# Patient Record
Sex: Male | Born: 1975 | Race: Black or African American | Hispanic: No | Marital: Single | Smoking: Never smoker
Health system: Southern US, Community
[De-identification: ages and names within clinical notes are randomized; demographics above are authoritative.]

---

## 2002-01-30 HISTORY — PX: CHOLECYSTECTOMY: SHX55

## 2008-05-27 ENCOUNTER — Emergency Department (HOSPITAL_COMMUNITY): Admission: EM | Admit: 2008-05-27 | Discharge: 2008-05-27 | Payer: Self-pay | Admitting: Emergency Medicine

## 2009-03-20 ENCOUNTER — Emergency Department: Payer: Self-pay | Admitting: Unknown Physician Specialty

## 2012-08-29 ENCOUNTER — Emergency Department: Payer: Self-pay | Admitting: Emergency Medicine

## 2012-09-04 ENCOUNTER — Ambulatory Visit: Payer: Self-pay | Admitting: Family Medicine

## 2013-01-26 ENCOUNTER — Emergency Department: Payer: Self-pay | Admitting: Emergency Medicine

## 2013-01-26 LAB — RAPID INFLUENZA A&B ANTIGENS

## 2015-02-26 ENCOUNTER — Ambulatory Visit: Payer: Self-pay | Admitting: Family Medicine

## 2015-03-09 ENCOUNTER — Ambulatory Visit (INDEPENDENT_AMBULATORY_CARE_PROVIDER_SITE_OTHER): Payer: Self-pay | Admitting: Family Medicine

## 2015-03-09 ENCOUNTER — Encounter: Payer: Self-pay | Admitting: Family Medicine

## 2015-03-09 VITALS — BP 120/80 | HR 84 | Temp 98.4°F | Resp 16 | Ht 67.0 in | Wt 195.4 lb

## 2015-03-09 DIAGNOSIS — M545 Low back pain, unspecified: Secondary | ICD-10-CM | POA: Insufficient documentation

## 2015-03-09 DIAGNOSIS — J069 Acute upper respiratory infection, unspecified: Secondary | ICD-10-CM

## 2015-03-09 DIAGNOSIS — R0789 Other chest pain: Secondary | ICD-10-CM

## 2015-03-09 DIAGNOSIS — G8929 Other chronic pain: Secondary | ICD-10-CM | POA: Insufficient documentation

## 2015-03-09 DIAGNOSIS — Z23 Encounter for immunization: Secondary | ICD-10-CM

## 2015-03-09 DIAGNOSIS — M940 Chondrocostal junction syndrome [Tietze]: Secondary | ICD-10-CM

## 2015-03-09 MED ORDER — IBUPROFEN 800 MG PO TABS: 800.0000 mg | ORAL_TABLET | Freq: Three times a day (TID) | ORAL | Status: DC

## 2015-03-09 MED ORDER — HYDROCOD POLST-CPM POLST ER 10-8 MG/5ML PO SUER: 5.0000 mL | Freq: Every evening | ORAL | Status: DC | PRN

## 2015-03-09 MED ORDER — METAXALONE 800 MG PO TABS
800.0000 mg | ORAL_TABLET | Freq: Three times a day (TID) | ORAL | Status: DC
Start: 2015-03-09 — End: 2017-04-12

## 2015-03-09 NOTE — Addendum Note (Signed)
Addended by: Steele Sizer F on: 03/09/2015 04:02 PM   Modules accepted: Miquel Dunn
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
## 2015-03-09 NOTE — Patient Instructions (Addendum)
Check local pharmacy for Tdap and flu vaccinesMyofascial Pain Syndrome and Fibromyalgia Myofascial pain syndrome and fibromyalgia are both pain disorders. This pain may be felt mainly in your muscles.   Myofascial pain syndrome:  Always has trigger points or tender points in the muscle that will cause pain when pressed. The pain may come and go.  Usually affects your neck, upper back, and shoulder areas. The pain often radiates into your arms and hands.  Fibromyalgia:  Has muscle pains and tenderness that come and go.  Is often associated with fatigue and sleep disturbances.  Has trigger points.  Tends to be long-lasting (chronic), but is not life-threatening. Fibromyalgia and myofascial pain are not the same. However, they often occur together. If you have both conditions, each can make the other worse. Both are common and can cause enough pain and fatigue to make day-to-day activities difficult.  CAUSES  The exact causes of fibromyalgia and myofascial pain are not known. People with certain gene types may be more likely to develop fibromyalgia. Some factors can be triggers for both conditions, such as:   Spine disorders.  Arthritis.  Severe injury (trauma) and other physical stressors.  Being under a lot of stress.  A medical illness. SIGNS AND SYMPTOMS  Fibromyalgia The main symptom of fibromyalgia is widespread pain and tenderness in your muscles. This can vary over time. Pain is sometimes described as stabbing, shooting, or burning. You may have tingling or numbness, too. You may also have sleep problems and fatigue. You may wake up feeling tired and groggy (fibro fog). Other symptoms may include:   Bowel and bladder problems.  Headaches.  Visual problems.  Problems with odors and noises.  Depression or mood changes.  Painful menstrual periods (dysmenorrhea).  Dry skin or eyes. Myofascial pain syndrome Symptoms of myofascial pain syndrome include:   Tight, ropy  bands of muscle.   Uncomfortable sensations in muscular areas, such as:  Aching.  Cramping.  Burning.  Numbness.  Tingling.   Muscle weakness.  Trouble moving certain muscles freely (range of motion). DIAGNOSIS  There are no specific tests to diagnose fibromyalgia or myofascial pain syndrome. Both can be hard to diagnose because their symptoms are common in many other conditions. Your health care provider may suspect one or both of these conditions based on your symptoms and medical history. Your health care provider will also do a physical exam.  The key to diagnosing fibromyalgia is having pain, fatigue, and other symptoms for more than three months that cannot be explained by another condition.  The key to diagnosing myofascial pain syndrome is finding trigger points in muscles that are tender and cause pain elsewhere in your body (referred pain). TREATMENT  Treating fibromyalgia and myofascial pain often requires a team of health care providers. This usually starts with your primary provider and a physical therapist. You may also find it helpful to work with alternative health care providers, such as massage therapists or acupuncturists. Treatment for fibromyalgia may include medicines. This may include nonsteroidal anti-inflammatory drugs (NSAIDs), along with other medicines.  Treatment for myofascial pain may also include:  NSAIDs.  Cooling and stretching of muscles.  Trigger point injections.  Sound wave (ultrasound) treatments to stimulate muscles. HOME CARE INSTRUCTIONS   Take medicines only as directed by your health care provider.  Exercise as directed by your health care provider or physical therapist.  Try to avoid stressful situations.  Practice relaxation techniques to control your stress. You may want to try:  Biofeedback.  Visual imagery.  Hypnosis.  Muscle relaxation.  Yoga.  Meditation.  Talk to your health care provider about alternative  treatments, such as acupuncture or massage treatment.  Maintain a healthy lifestyle. This includes eating a healthy diet and getting enough sleep.  Consider joining a support group.  Do not do activities that stress or strain your muscles. That includes repetitive motions and heavy lifting. SEEK MEDICAL CARE IF:   You have new symptoms.  Your symptoms get worse.  You have side effects from your medicines.  You have trouble sleeping.  Your condition is causing depression or anxiety. FOR MORE INFORMATION   National Fibromyalgia Association: http://www.fmaware.orgwww.fmaware.[place]: http://www.arthritis.orgwww.arthritis.org  American Chronic Pain Association: StreetWrestling.at.https://stevens.biz/   This information is not intended to replace advice given to you by your health care provider. Make sure you discuss any questions you have with your health care provider.   Document Released: 01/16/2005 Document Revised: 02/06/2014 Document Reviewed: 10/22/2013 Elsevier Interactive Patient Education 2016 Elsevier Inc. Costochondritis Costochondritis, sometimes called Tietze syndrome, is a swelling and irritation (inflammation) of the tissue (cartilage) that connects your ribs with your breastbone (sternum). It causes pain in the chest and rib area. Costochondritis usually goes away on its own over time. It can take up to 6 weeks or longer to get better, especially if you are unable to limit your activities. CAUSES  Some cases of costochondritis have no known cause. Possible causes include:  Injury (trauma).  Exercise or activity such as lifting.  Severe coughing. SIGNS AND SYMPTOMS  Pain and tenderness in the chest and rib area.  Pain that gets worse when coughing or taking deep breaths.  Pain that gets worse with specific movements. DIAGNOSIS  Your health care provider will do a physical exam and ask about  your symptoms. Chest X-rays or other tests may be done to rule out other problems. TREATMENT  Costochondritis usually goes away on its own over time. Your health care provider may prescribe medicine to help relieve pain. HOME CARE INSTRUCTIONS   Avoid exhausting physical activity. Try not to strain your ribs during normal activity. This would include any activities using chest, abdominal, and side muscles, especially if heavy weights are used.  Apply ice to the affected area for the first 2 days after the pain begins.  Put ice in a plastic bag.  Place a towel between your skin and the bag.  Leave the ice on for 20 minutes, 2-3 times a day.  Only take over-the-counter or prescription medicines as directed by your health care provider. SEEK MEDICAL CARE IF:  You have redness or swelling at the rib joints. These are signs of infection.  Your pain does not go away despite rest or medicine. SEEK IMMEDIATE MEDICAL CARE IF:   Your pain increases or you are very uncomfortable.  You have shortness of breath or difficulty breathing.  You cough up blood.  You have worse chest pains, sweating, or vomiting.  You have a fever or persistent symptoms for more than 2-3 days.  You have a fever and your symptoms suddenly get worse. MAKE SURE YOU:   Understand these instructions.  Will watch your condition.  Will get help right away if you are not doing well or get worse.   This information is not intended to replace advice given to you by your health care provider. Make sure you discuss any questions you have with your health care provider.   Document Released: 10/26/2004 Document Revised: 11/06/2012 Document  Reviewed: 08/20/2012 Elsevier Interactive Patient Education [employer].

## 2015-03-10 ENCOUNTER — Telehealth: Payer: Self-pay | Admitting: Family Medicine

## 2015-03-10 NOTE — Telephone Encounter (Signed)
Pt would like a call back. Pt states the meds that was given to him yesterday for his cough is to expensive and wants to know if something else can be called in.

## 2015-03-11 ENCOUNTER — Encounter: Payer: Self-pay | Admitting: Family Medicine

## 2015-03-11 MED ORDER — BENZONATATE 100 MG PO CAPS: 100.0000 mg | ORAL_CAPSULE | Freq: Three times a day (TID) | ORAL | Status: DC | PRN

## 2015-03-11 NOTE — Telephone Encounter (Signed)
Patient notified

## 2015-03-11 NOTE — Telephone Encounter (Signed)
Sent benzonate

## 2017-04-12 ENCOUNTER — Encounter: Payer: Self-pay | Admitting: Family Medicine

## 2017-04-12 ENCOUNTER — Ambulatory Visit (INDEPENDENT_AMBULATORY_CARE_PROVIDER_SITE_OTHER): Payer: 59 | Admitting: Family Medicine

## 2017-04-12 VITALS — BP 104/64 | HR 84 | Temp 98.0°F | Resp 16 | Ht 67.0 in | Wt 194.6 lb

## 2017-04-12 DIAGNOSIS — Z113 Encounter for screening for infections with a predominantly sexual mode of transmission: Secondary | ICD-10-CM | POA: Diagnosis not present

## 2017-04-12 DIAGNOSIS — Z130 Encounter for screening for diseases of the blood and blood-forming organs and certain disorders involving the immune mechanism: Secondary | ICD-10-CM | POA: Diagnosis not present

## 2017-04-12 DIAGNOSIS — Z23 Encounter for immunization: Secondary | ICD-10-CM

## 2017-04-12 DIAGNOSIS — M940 Chondrocostal junction syndrome [Tietze]: Secondary | ICD-10-CM

## 2017-04-12 DIAGNOSIS — R0789 Other chest pain: Secondary | ICD-10-CM

## 2017-04-12 DIAGNOSIS — Z131 Encounter for screening for diabetes mellitus: Secondary | ICD-10-CM | POA: Diagnosis not present

## 2017-04-12 DIAGNOSIS — Z13220 Encounter for screening for lipoid disorders: Secondary | ICD-10-CM

## 2017-04-12 DIAGNOSIS — E66811 Obesity, class 1: Secondary | ICD-10-CM

## 2017-04-12 DIAGNOSIS — E669 Obesity, unspecified: Secondary | ICD-10-CM | POA: Diagnosis not present

## 2017-04-12 MED ORDER — IBUPROFEN 800 MG PO TABS: 800.0000 mg | ORAL_TABLET | Freq: Three times a day (TID) | ORAL | 0 refills | Status: DC

## 2017-04-12 NOTE — Addendum Note (Signed)
Addended by: Vonna Kotyk L on: 04/12/2017 12:40 PM   Modules accepted: Orders
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
## 2017-04-13 LAB — COMPLETE METABOLIC PANEL WITH GFR
AG RATIO: 1.8 (calc) (ref 1.0–2.5)
ALT: 72 U/L — AB (ref 9–46)
AST: 37 U/L (ref 10–40)
Albumin: 4.6 g/dL (ref 3.6–5.1)
Alkaline phosphatase (APISO): 38 U/L — ABNORMAL LOW (ref 40–115)
BUN/Creatinine Ratio: 12 (calc) (ref 6–22)
BUN: 19 mg/dL (ref 7–25)
CALCIUM: 9.9 mg/dL (ref 8.6–10.3)
CHLORIDE: 104 mmol/L (ref 98–110)
CO2: 29 mmol/L (ref 20–32)
Creat: 1.53 mg/dL — ABNORMAL HIGH (ref 0.60–1.35)
GFR, EST NON AFRICAN AMERICAN: 56 mL/min/{1.73_m2} — AB (ref >=60)
GFR, Est African American: 65 mL/min/{1.73_m2} (ref >=60)
GLUCOSE: 85 mg/dL (ref 65–139)
Globulin: 2.5 g/dL (calc) (ref 1.9–3.7)
POTASSIUM: 4.1 mmol/L (ref 3.5–5.3)
Sodium: 140 mmol/L (ref 135–146)
TOTAL PROTEIN: 7.1 g/dL (ref 6.1–8.1)
Total Bilirubin: 0.9 mg/dL (ref 0.2–1.2)

## 2017-04-13 LAB — LIPID PANEL
Cholesterol: 186 mg/dL (ref <200)
HDL: 54 mg/dL (ref >40)
LDL CHOLESTEROL (CALC): 107 mg/dL — AB
NON-HDL CHOLESTEROL (CALC): 132 mg/dL — AB (ref <130)
Total CHOL/HDL Ratio: 3.4 (calc) (ref <5.0)
Triglycerides: 132 mg/dL (ref <150)

## 2017-04-13 LAB — CBC WITH DIFFERENTIAL/PLATELET
BASOS PCT: 0.5 %
Basophils Absolute: 32 cells/uL (ref 0–200)
EOS PCT: 2.2 %
Eosinophils Absolute: 139 cells/uL (ref 15–500)
HCT: 47.5 % (ref 38.5–50.0)
Hemoglobin: 16.6 g/dL (ref 13.2–17.1)
Lymphs Abs: 2400 cells/uL (ref 850–3900)
MCH: 27.9 pg (ref 27.0–33.0)
MCHC: 34.9 g/dL (ref 32.0–36.0)
MCV: 80 fL (ref 80.0–100.0)
MONOS PCT: 7.6 %
MPV: 10.3 fL (ref 7.5–12.5)
NEUTROS PCT: 51.6 %
Neutro Abs: 3251 cells/uL (ref 1500–7800)
PLATELETS: 305 10*3/uL (ref 140–400)
RBC: 5.94 10*6/uL — AB (ref 4.20–5.80)
RDW: 12.9 % (ref 11.0–15.0)
TOTAL LYMPHOCYTE: 38.1 %
WBC mixed population: 479 cells/uL (ref 200–950)
WBC: 6.3 10*3/uL (ref 3.8–10.8)

## 2017-04-13 LAB — HEMOGLOBIN A1C
EAG (MMOL/L): 7 (calc)
Hgb A1c MFr Bld: 6 % of total Hgb — ABNORMAL HIGH (ref <5.7)
Mean Plasma Glucose: 126 (calc)

## 2017-04-13 LAB — HEPATITIS PANEL, ACUTE
Hep A IgM: NONREACTIVE
Hep B C IgM: NONREACTIVE
Hepatitis B Surface Ag: NONREACTIVE
Hepatitis C Ab: NONREACTIVE
SIGNAL TO CUT-OFF: 0.05 (ref <1.00)

## 2017-04-13 LAB — C. TRACHOMATIS/N. GONORRHOEAE RNA
C. trachomatis RNA, TMA: NOT DETECTED
N. GONORRHOEAE RNA, TMA: NOT DETECTED

## 2017-04-13 LAB — RPR: RPR: NONREACTIVE

## 2017-04-13 LAB — HIV ANTIBODY (ROUTINE TESTING W REFLEX): HIV: NONREACTIVE

## 2017-04-14 ENCOUNTER — Encounter: Payer: Self-pay | Admitting: Family Medicine

## 2017-04-14 DIAGNOSIS — R739 Hyperglycemia, unspecified: Secondary | ICD-10-CM | POA: Insufficient documentation

## 2017-04-14 DIAGNOSIS — E785 Hyperlipidemia, unspecified: Secondary | ICD-10-CM | POA: Insufficient documentation

## 2017-04-16 ENCOUNTER — Ambulatory Visit
Admission: RE | Admit: 2017-04-16 | Discharge: 2017-04-16 | Disposition: A | Discharge status: Home / Self Care | Payer: 59 | Source: Ambulatory Visit | Attending: Family Medicine | Admitting: Family Medicine

## 2017-04-16 DIAGNOSIS — R0789 Other chest pain: Secondary | ICD-10-CM | POA: Insufficient documentation

## 2017-04-16 NOTE — Patient Instructions (Signed)
Diabetes Mellitus and Nutrition When you have diabetes (diabetes mellitus), it is very important to have healthy eating habits because your blood sugar (glucose) levels are greatly affected by what you eat and drink. Eating healthy foods in the appropriate amounts, at about the same times every day, can help you:  Control your blood glucose.  Lower your risk of heart disease.  Improve your blood pressure.  Reach or maintain a healthy weight.  Every person with diabetes is different, and each person has different needs for a meal plan. Your health care provider may recommend that you work with a diet and nutrition specialist (dietitian) to make a meal plan that is best for you. Your meal plan may vary depending on factors such as:  The calories you need.  The medicines you take.  Your weight.  Your blood glucose, blood pressure, and cholesterol levels.  Your activity level.  Other health conditions you have, such as heart or kidney disease.  How do carbohydrates affect me? Carbohydrates affect your blood glucose level more than any other type of food. Eating carbohydrates naturally increases the amount of glucose in your blood. Carbohydrate counting is a method for keeping track of how many carbohydrates you eat. Counting carbohydrates is important to keep your blood glucose at a healthy level, especially if you use insulin or take certain oral diabetes medicines. It is important to know how many carbohydrates you can safely have in each meal. This is different for every person. Your dietitian can help you calculate how many carbohydrates you should have at each meal and for snack. Foods that contain carbohydrates include:  Bread, cereal, rice, pasta, and crackers.  Potatoes and corn.  Peas, beans, and lentils.  Milk and yogurt.  Fruit and juice.  Desserts, such as cakes, cookies, ice cream, and candy.  How does alcohol affect me? Alcohol can cause a sudden decrease in blood  glucose (hypoglycemia), especially if you use insulin or take certain oral diabetes medicines. Hypoglycemia can be a life-threatening condition. Symptoms of hypoglycemia (sleepiness, dizziness, and confusion) are similar to symptoms of having too much alcohol. If your health care provider says that alcohol is safe for you, follow these guidelines:  Limit alcohol intake to no more than 1 drink per day for nonpregnant women and 2 drinks per day for men. One drink equals 12 oz of beer, 5 oz of wine, or 1 oz of hard liquor.  Do not drink on an empty stomach.  Keep yourself hydrated with water, diet soda, or unsweetened iced tea.  Keep in mind that regular soda, juice, and other mixers may contain a lot of sugar and must be counted as carbohydrates.  What are tips for following this plan? Reading food labels  Start by checking the serving size on the label. The amount of calories, carbohydrates, fats, and other nutrients listed on the label are based on one serving of the food. Many foods contain more than one serving per package.  Check the total grams (g) of carbohydrates in one serving. You can calculate the number of servings of carbohydrates in one serving by dividing the total carbohydrates by 15. For example, if a food has 30 g of total carbohydrates, it would be equal to 2 servings of carbohydrates.  Check the number of grams (g) of saturated and trans fats in one serving. Choose foods that have low or no amount of these fats.  Check the number of milligrams (mg) of sodium in one serving. Most people   should limit total sodium intake to less than 2,300 mg per day.  Always check the nutrition information of foods labeled as "low-fat" or "nonfat". These foods may be higher in added sugar or refined carbohydrates and should be avoided.  Talk to your dietitian to identify your daily goals for nutrients listed on the label. Shopping  Avoid buying canned, premade, or processed foods. These  foods tend to be high in fat, sodium, and added sugar.  Shop around the outside edge of the grocery store. This includes fresh fruits and vegetables, bulk grains, fresh meats, and fresh dairy. Cooking  Use low-heat cooking methods, such as baking, instead of high-heat cooking methods like deep frying.  Cook using healthy oils, such as olive, canola, or sunflower oil.  Avoid cooking with butter, cream, or high-fat meats. Meal planning  Eat meals and snacks regularly, preferably at the same times every day. Avoid going long periods of time without eating.  Eat foods high in fiber, such as fresh fruits, vegetables, beans, and whole grains. Talk to your dietitian about how many servings of carbohydrates you can eat at each meal.  Eat 4-6 ounces of lean protein each day, such as lean meat, chicken, fish, eggs, or tofu. 1 ounce is equal to 1 ounce of meat, chicken, or fish, 1 egg, or 1/4 cup of tofu.  Eat some foods each day that contain healthy fats, such as avocado, nuts, seeds, and fish. Lifestyle   Check your blood glucose regularly.  Exercise at least 30 minutes 5 or more days each week, or as told by your health care provider.  Take medicines as told by your health care provider.  Do not use any products that contain nicotine or tobacco, such as cigarettes and e-cigarettes. If you need help quitting, ask your health care provider.  Work with a counselor or diabetes educator to identify strategies to manage stress and any emotional and social challenges. What are some questions to ask my health care provider?  Do I need to meet with a diabetes educator?  Do I need to meet with a dietitian?  What number can I call if I have questions?  When are the best times to check my blood glucose? Where to find more information:  American Diabetes Association: diabetes.org/food-and-fitness/food  Academy of Nutrition and Dietetics:  www.eatright.org/resources/health/diseases-and-conditions/diabetes  National Institute of Diabetes and Digestive and Kidney Diseases (NIH): www.niddk.nih.gov/health-information/diabetes/overview/diet-eating-physical-activity Summary  A healthy meal plan will help you control your blood glucose and maintain a healthy lifestyle.  Working with a diet and nutrition specialist (dietitian) can help you make a meal plan that is best for you.  Keep in mind that carbohydrates and alcohol have immediate effects on your blood glucose levels. It is important to count carbohydrates and to use alcohol carefully. This information is not intended to replace advice given to you by your health care provider. Make sure you discuss any questions you have with your health care provider. Document Released: 10/13/2004 Document Revised: 02/21/2016 Document Reviewed: 02/21/2016 Elsevier Interactive Patient Education  2018 Elsevier Inc.  

## 2017-04-19 ENCOUNTER — Encounter: Payer: Self-pay | Admitting: Family Medicine

## 2017-04-25 ENCOUNTER — Encounter: Payer: Self-pay | Admitting: Family Medicine

## 2017-04-25 ENCOUNTER — Other Ambulatory Visit: Payer: Self-pay | Admitting: Family Medicine

## 2017-04-25 DIAGNOSIS — R7303 Prediabetes: Secondary | ICD-10-CM

## 2017-04-25 DIAGNOSIS — R0789 Other chest pain: Secondary | ICD-10-CM

## 2017-05-01 ENCOUNTER — Ambulatory Visit: Payer: Self-pay | Admitting: Family Medicine

## 2017-05-31 ENCOUNTER — Encounter: Payer: Self-pay | Admitting: Family Medicine

## 2017-06-21 ENCOUNTER — Ambulatory Visit
Admission: RE | Admit: 2017-06-21 | Discharge: 2017-06-21 | Disposition: A | Discharge status: Home / Self Care | Payer: 59 | Source: Ambulatory Visit | Attending: Family Medicine | Admitting: Family Medicine

## 2017-06-21 DIAGNOSIS — R0789 Other chest pain: Secondary | ICD-10-CM | POA: Diagnosis not present

## 2017-10-10 ENCOUNTER — Encounter: Payer: 59 | Admitting: Family Medicine

## 2017-10-10 ENCOUNTER — Encounter: Payer: Self-pay | Admitting: Family Medicine

## 2017-10-10 ENCOUNTER — Ambulatory Visit (INDEPENDENT_AMBULATORY_CARE_PROVIDER_SITE_OTHER): Payer: 59 | Admitting: Family Medicine

## 2017-10-10 VITALS — BP 106/62 | HR 67 | Temp 97.6°F | Resp 16 | Ht 67.0 in | Wt 178.5 lb

## 2017-10-10 DIAGNOSIS — R7303 Prediabetes: Secondary | ICD-10-CM

## 2017-10-10 DIAGNOSIS — Z130 Encounter for screening for diseases of the blood and blood-forming organs and certain disorders involving the immune mechanism: Secondary | ICD-10-CM

## 2017-10-10 DIAGNOSIS — Z23 Encounter for immunization: Secondary | ICD-10-CM | POA: Diagnosis not present

## 2017-10-10 DIAGNOSIS — Z0000 Encounter for general adult medical examination without abnormal findings: Secondary | ICD-10-CM

## 2017-10-10 DIAGNOSIS — Z113 Encounter for screening for infections with a predominantly sexual mode of transmission: Secondary | ICD-10-CM

## 2017-10-10 DIAGNOSIS — Z13220 Encounter for screening for lipoid disorders: Secondary | ICD-10-CM | POA: Diagnosis not present

## 2017-10-10 NOTE — Patient Instructions (Signed)

## 2017-10-10 NOTE — Progress Notes (Signed)
Name: Ethan Dickerson.   MRN: [id number]    DOB: 06/23/1975   Date:10/10/2017       Progress Note  Subjective  Chief Complaint  Chief Complaint  Patient presents with  . Annual Exam    patient sleeps about 5 hrs. patient eats a well balance diet.  . Orders    colorguard    HPI  Patient presents for annual CPE and follow up.  Pre-diabetes: his last hgbA1C was 6.0% , he lost almost 18 lbs since life style modification. He continues be physically active, avoiding sweets, and eating less carbohydrates in general. 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 Does not affect his sleep. States he lost weight because of life style modification and feels well  USPSTF grade A and B recommendations:  Diet: since March of 2019 he has been eating a very low sugar diet, also on carbohydrates. He lost weight, afraid of developing DM   Depression:  Depression screen [facility] 2/9 10/10/2017 10/10/2017 04/12/2017 03/09/2015  Decreased Interest 0 0 0 0  Down, Depressed, Hopeless 0 0 0 0  PHQ - 2 Score 0 0 0 0  Altered sleeping 0 - - -  Tired, decreased energy 2 - - -  Change in appetite 0 - - -  Feeling bad or failure about yourself  0 - - -  Trouble concentrating 0 - - -  Moving slowly or fidgety/restless 0 - - -  Suicidal thoughts 0 - - -  PHQ-9 Score 2 - - -  Difficult doing work/chores Not difficult at all - - -    Hypertension:  BP Readings from Last 3 Encounters:  10/10/17 106/62  04/12/17 104/64  03/09/15 120/80    Obesity: Wt Readings from Last 3 Encounters:  10/10/17 178 lb 8 oz (81 kg)  04/12/17 194 lb 9.6 oz (88.3 kg)  03/09/15 195 lb 6.4 oz (88.6 kg)   BMI Readings from Last 3 Encounters:  10/10/17 27.96 kg/m  04/12/17 30.48 kg/m  03/09/15 30.60 kg/m     Lipids:  Lab Results  Component Value Date   CHOL 186 04/12/2017   Lab Results  Component  Value Date   HDL 54 04/12/2017   Lab Results  Component Value Date   LDLCALC 107 (H) 04/12/2017   Lab Results  Component Value Date   TRIG 132 04/12/2017   Lab Results  Component Value Date   CHOLHDL 3.4 04/12/2017   No results found for: LDLDIRECT Glucose:  Glucose, Bld  Date Value Ref Range Status  04/12/2017 85 65 - 139 mg/dL Final    Comment:    .        Non-fasting reference interval .       [facility] Visit from 10/10/2017 in [facility]  AUDIT-C Score  0      Single STD testing and prevention (HIV/chl/gon/syphilis): N/A same partner, checked 03/2017  Skin cancer: discussed atypical moles  Colorectal cancer: he will check coverage with insurance Prostate cancer: discussed USPTF  IPSS Questionnaire (AUA-7): Over the past month.   1)  How often have you had a sensation of not emptying your bladder completely after you finish urinating?  0 - Not at all  2)  How often have you had to urinate again less than two hours after you finished urinating? 1 - Less than 1 time in  5  3)  How often have you found you stopped and started again several times when you urinated?  0 - Not at all  4) How difficult have you found it to postpone urination?  0 - Not at all  5) How often have you had a weak urinary stream?  0 - Not at all  6) How often have you had to push or strain to begin urination?  0 - Not at all  7) How many times did you most typically get up to urinate from the time you went to bed until the time you got up in the morning?  0 - None  Total score:  0-7 mildly symptomatic   8-19 moderately symptomatic   20-35 severely symptomatic    ECG:  2017  Advanced Care Planning: A voluntary discussion about advance care planning including the explanation and discussion of advance directives.  Discussed health care proxy and Living will, and the patient was able to identify a health care proxy as Raelyn Mora   Patient does not have a living will at  present time. If patient does have living will, I have requested they bring this to the clinic to be scanned in to their chart.  Patient Active Problem List   Diagnosis Date Noted  . Hyperglycemia 04/14/2017  . Dyslipidemia 04/14/2017  . Chronic low back pain without sciatica 03/09/2015    Past Surgical History:  Procedure Laterality Date  . CHOLECYSTECTOMY  2004    Family History  Problem Relation Age of Onset  . Diabetes Father   . Hypertension Father   . Alcohol abuse Father   . Alcohol abuse Mother   . Cirrhosis Mother   . Thyroid disease Sister   . Thyroid disease Sister   . Breast cancer Paternal Aunt     Social History   Socioeconomic History  . Marital status: Single    Spouse name: Not on file  . Number of children: 2  . Years of education: Not on file  . Highest education level: Associate degree: academic program  Occupational History  . Occupation: Scientific laboratory technician: [employer]    Comment: [place]  . Financial resource strain: Not very hard  . Food insecurity:    Worry: Never true    Inability: Never true  . Transportation needs:    Medical: No    Non-medical: No  Tobacco Use  . Smoking status: Never Smoker  . Smokeless tobacco: Never Used  Substance and Sexual Activity  . Alcohol use: No    Alcohol/week: 0.0 standard drinks  . Drug use: No  . Sexual activity: Yes    Partners: Female    Birth control/protection: None  Lifestyle  . Physical activity:    Days per week: 5 days    Minutes per session: 70 min  . Stress: Not at all  Relationships  . Social connections:    Talks on phone: More than three times a week    Gets together: More than three times a week    Attends religious service: 1 to 4 times per year    Active member of club or organization: Yes    Attends meetings of clubs or organizations: More than 4 times per year    Relationship status: Never married  . Intimate partner violence:    Fear of current or ex partner: No     Emotionally abused: No    Physically abused: No  Forced sexual activity: No  Other Topics Concern  . Not on file  Social History Narrative   Works full time and is a full Immunologist    He has two children, one in college the other one out of school    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[employer]    Also is a Product manager for young boys      Current Outpatient Medications:  .  ibuprofen (ADVIL,MOTRIN) 800 MG tablet, Take 1 tablet (800 mg total) by mouth 3 (three) times daily., Disp: 90 tablet, Rfl: 0 .  Multiple Vitamins-Minerals (CENTRUM MEN PO), Take by mouth., Disp: , Rfl:   No Known Allergies   ROS  Constitutional: Negative for fever, positive for  weight change - he stopped eating sugar  Respiratory: Negative for cough and shortness of breath.   Cardiovascular: Negative for chest pain ( soreness still present negative MRI)  or palpitations.  Gastrointestinal: Negative for abdominal pain, no bowel changes.  Musculoskeletal: Negative for gait problem or joint swelling.  Skin: Negative for rash.  Neurological: Negative for dizziness or headache.  No other specific complaints in a complete review of systems (except as listed in HPI above).  Objective  Vitals:   10/10/17 0814  BP: 106/62  Pulse: 67  Resp: 16  Temp: 97.6 F (36.4 C)  TempSrc: Oral  SpO2: 98%  Weight: 178 lb 8 oz (81 kg)  Height: 5\' 7"  (1.702 m)    Body mass index is 27.96 kg/m.  Physical Exam  Constitutional: Patient appears well-developed and well-nourished. Muscular. No distress.  HENT: Head: Normocephalic and atraumatic. Ears: B TMs ok, no erythema or effusion; Nose: Nose normal. Mouth/Throat: Oropharynx is clear and moist. No oropharyngeal exudate.  Eyes: Conjunctivae and EOM are normal. Pupils are equal, round, and reactive to light. No scleral icterus.  Neck: Normal range of motion. Neck supple. No JVD present. No thyromegaly present.  Cardiovascular: Normal rate, regular rhythm and  normal heart sounds.  No murmur heard. No BLE edema. Pulmonary/Chest: Effort normal and breath sounds normal. No respiratory distress. Abdominal: Soft. Bowel sounds are normal, no distension. There is no tenderness. no masses MALE GENITALIA: Normal descended testes bilaterally, no masses palpated, no hernias, no lesions, no discharge RECTAL: Prostate normal size and consistency, no rectal masses or hemorrhoids Musculoskeletal: Normal range of motion, no joint effusions. No gross deformities Neurological: he is alert and oriented to person, place, and time. No cranial nerve deficit. Coordination, balance, strength, speech and gait are normal.  Skin: Skin is warm and dry. No rash noted. No erythema.  Psychiatric: Patient has a normal mood and affect. behavior is normal. Judgment and thought content normal.  PHQ2/9: Depression screen [facility] 2/9 10/10/2017 10/10/2017 04/12/2017 03/09/2015  Decreased Interest 0 0 0 0  Down, Depressed, Hopeless 0 0 0 0  PHQ - 2 Score 0 0 0 0  Altered sleeping 0 - - -  Tired, decreased energy 2 - - -  Change in appetite 0 - - -  Feeling bad or failure about yourself  0 - - -  Trouble concentrating 0 - - -  Moving slowly or fidgety/restless 0 - - -  Suicidal thoughts 0 - - -  PHQ-9 Score 2 - - -  Difficult doing work/chores Not difficult at all - - -    Fall Risk: Fall Risk  10/10/2017 04/12/2017 03/09/2015  Falls in the past year? No No No      Assessment & Plan  1.  Encounter for routine history and physical exam for male  - CBC with Differential/Platelet - Hemoglobin A1c - COMPLETE METABOLIC PANEL WITH GFR  2. Lipid screening  Reviewed last labs   3. Pre-diabetes  - Hemoglobin A1c  4. Screening for deficiency anemia  -CBC  5. Routine screening for STI (sexually transmitted infection)  Done March 2019  6. Needs flu shot  - Flu Vaccine QUAD 6+ mos PF IM (Fluarix Quad PF)    -Prostate cancer screening and PSA options (with potential risks  and benefits of testing vs not testing) were discussed along with recent recs/guidelines. -USPSTF grade A and B recommendations reviewed with patient; age-appropriate recommendations, preventive care, screening tests, etc discussed and encouraged; healthy living encouraged; see AVS for patient education given to patient -Discussed importance of 150 minutes of physical activity weekly, eat two servings of fish weekly, eat one serving of tree nuts ( cashews, pistachios, pecans, almonds.Marland Kitchen) every other day, eat 6 servings of fruit/vegetables daily and drink plenty of water and avoid sweet beverages.

## 2017-10-11 LAB — CBC WITH DIFFERENTIAL/PLATELET
BASOS PCT: 0.3 %
Basophils Absolute: 18 cells/uL (ref 0–200)
EOS ABS: 148 {cells}/uL (ref 15–500)
Eosinophils Relative: 2.5 %
HCT: 46 % (ref 38.5–50.0)
Hemoglobin: 15.5 g/dL (ref 13.2–17.1)
Lymphs Abs: 1959 cells/uL (ref 850–3900)
MCH: 27.4 pg (ref 27.0–33.0)
MCHC: 33.7 g/dL (ref 32.0–36.0)
MCV: 81.3 fL (ref 80.0–100.0)
MPV: 10.2 fL (ref 7.5–12.5)
Monocytes Relative: 6.9 %
NEUTROS PCT: 57.1 %
Neutro Abs: 3369 cells/uL (ref 1500–7800)
PLATELETS: 259 10*3/uL (ref 140–400)
RBC: 5.66 10*6/uL (ref 4.20–5.80)
RDW: 12.9 % (ref 11.0–15.0)
TOTAL LYMPHOCYTE: 33.2 %
WBC: 5.9 10*3/uL (ref 3.8–10.8)
WBCMIX: 407 {cells}/uL (ref 200–950)

## 2017-10-11 LAB — COMPLETE METABOLIC PANEL WITH GFR
AG Ratio: 1.8 (calc) (ref 1.0–2.5)
ALBUMIN MSPROF: 4.4 g/dL (ref 3.6–5.1)
ALT: 33 U/L (ref 9–46)
AST: 25 U/L (ref 10–40)
Alkaline phosphatase (APISO): 34 U/L — ABNORMAL LOW (ref 40–115)
BILIRUBIN TOTAL: 0.9 mg/dL (ref 0.2–1.2)
BUN / CREAT RATIO: 12 (calc) (ref 6–22)
BUN: 18 mg/dL (ref 7–25)
CO2: 26 mmol/L (ref 20–32)
CREATININE: 1.48 mg/dL — AB (ref 0.60–1.35)
Calcium: 9.7 mg/dL (ref 8.6–10.3)
Chloride: 104 mmol/L (ref 98–110)
GFR, EST AFRICAN AMERICAN: 67 mL/min/{1.73_m2} (ref >=60)
GFR, EST NON AFRICAN AMERICAN: 58 mL/min/{1.73_m2} — AB (ref >=60)
GLUCOSE: 106 mg/dL (ref 65–139)
Globulin: 2.5 g/dL (calc) (ref 1.9–3.7)
Potassium: 4.3 mmol/L (ref 3.5–5.3)
Sodium: 140 mmol/L (ref 135–146)
TOTAL PROTEIN: 6.9 g/dL (ref 6.1–8.1)

## 2017-10-11 LAB — HEMOGLOBIN A1C
Hgb A1c MFr Bld: 5.8 % of total Hgb — ABNORMAL HIGH (ref <5.7)
Mean Plasma Glucose: 120 (calc)
eAG (mmol/L): 6.6 (calc)

## 2018-10-15 ENCOUNTER — Encounter: Payer: 59 | Admitting: Family Medicine

## 2018-12-20 ENCOUNTER — Other Ambulatory Visit: Payer: Self-pay

## 2018-12-20 ENCOUNTER — Ambulatory Visit (INDEPENDENT_AMBULATORY_CARE_PROVIDER_SITE_OTHER): Payer: 59 | Admitting: Family Medicine

## 2018-12-20 ENCOUNTER — Encounter: Payer: Self-pay | Admitting: Family Medicine

## 2018-12-20 VITALS — BP 104/70 | HR 78 | Temp 97.7°F | Resp 18 | Ht 67.0 in | Wt 191.6 lb

## 2018-12-20 DIAGNOSIS — Z23 Encounter for immunization: Secondary | ICD-10-CM

## 2018-12-20 DIAGNOSIS — N390 Urinary tract infection, site not specified: Secondary | ICD-10-CM

## 2018-12-20 DIAGNOSIS — Z20818 Contact with and (suspected) exposure to other bacterial communicable diseases: Secondary | ICD-10-CM

## 2018-12-20 LAB — POCT URINALYSIS DIPSTICK
Bilirubin, UA: NEGATIVE
Blood, UA: NEGATIVE
Glucose, UA: NEGATIVE
Ketones, UA: NEGATIVE
Leukocytes, UA: NEGATIVE
Nitrite, UA: NEGATIVE
Protein, UA: NEGATIVE
Spec Grav, UA: 1.015 (ref 1.010–1.025)
Urobilinogen, UA: 0.2 E.U./dL
pH, UA: 6 (ref 5.0–8.0)

## 2018-12-20 NOTE — Progress Notes (Signed)
Patient ID: Ethan Gotz., male    DOB: 30-Sep-1975, 43 y.o.   MRN: [id number]  PCP: Steele Sizer, MD  Chief Complaint  Patient presents with   Urinary Tract Infection    Girlfriend has a UTI and was given Doxycycline. He wants to be checked out to see if he has a UTI since he has no symptoms    Subjective:   Ethan Budzynski. is a 43 y.o. male, presents to clinic with CC of the following:  HPI  Girlfriend has been having recurrent urinary sx and vaginal sx - her PCP recommended the pt be checked.  He denies any penile discharge, dysuria, hematuria, urinary frequency urgency, genital lesions sores rashes, groin pain or lymphadenopathy, scrotal swelling or testicular pain  Results for orders placed or performed in visit on 12/20/18  POCT urinalysis dipstick  Result Value Ref Range   Color, UA yellow    Clarity, UA clear    Glucose, UA Negative Negative   Bilirubin, UA neg    Ketones, UA neg    Spec Grav, UA 1.015 1.010 - 1.025   Blood, UA neg    pH, UA 6.0 5.0 - 8.0   Protein, UA Negative Negative   Urobilinogen, UA 0.2 0.2 or 1.0 E.U./dL   Nitrite, UA neg    Leukocytes, UA Negative Negative   Appearance     Odor     Pt denies any genital lesions, sores, rashes.  Denies penile discharge, hematuria, urinary urgency or frequency.  No history for either partner in the past  Ureaplasma bacteria has been what his girlfriend was told to test for -she is having recurrent infections with cultures positive for Ureaplasma bacteria  Patient has girlfriend her only sexual activity child or no other recent partners   Patient Active Problem List   Diagnosis Date Noted   Hyperglycemia 04/14/2017   Dyslipidemia 04/14/2017   Chronic low back pain without sciatica 03/09/2015      Current Outpatient Medications:    ibuprofen (ADVIL,MOTRIN) 800 MG tablet, Take 1 tablet (800 mg total) by mouth 3 (three) times daily., Disp: 90 tablet, Rfl: 0   Multiple Vitamins-Minerals  (CENTRUM MEN PO), Take by mouth., Disp: , Rfl:    No Known Allergies   Family History  Problem Relation Age of Onset   Diabetes Father    Hypertension Father    Alcohol abuse Father    Alcohol abuse Mother    Cirrhosis Mother    Thyroid disease Sister    Thyroid disease Sister    Breast cancer Paternal Aunt      Social History   Socioeconomic History   Marital status: Single    Spouse name: Not on file   Number of children: 2   Years of education: Not on file   Highest education level: Associate degree: academic program  Occupational History   Occupation: Scientific laboratory technician: [employer]    Comment: [employer]  Social Designer, fashion/clothing strain: Not very hard   Food insecurity    Worry: Never true    Inability: Never true   Transportation needs    Medical: No    Non-medical: No  Tobacco Use   Smoking status: Never Smoker   Smokeless tobacco: Never Used  Substance and Sexual Activity   Alcohol use: No    Alcohol/week: 0.0 standard drinks   Drug use: No   Sexual activity: Yes    Partners: Female  Birth control/protection: None  Lifestyle   Physical activity    Days per week: 5 days    Minutes per session: 70 min   Stress: Not at all  Relationships   Social connections    Talks on phone: More than three times a week    Gets together: More than three times a week    Attends religious service: 1 to 4 times per year    Active member of club or organization: Yes    Attends meetings of clubs or organizations: More than 4 times per year    Relationship status: Never married   Intimate partner violence    Fear of current or ex partner: No    Emotionally abused: No    Physically abused: No    Forced sexual activity: No  Other Topics Concern   Not on file  Social History Narrative   Works full time and is a full Immunologist    He has two children, one in college the other one out of school    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  Review of Systems  Constitutional: Negative.   HENT: Negative.   Eyes: Negative.   Respiratory: Negative.   Cardiovascular: Negative.   Gastrointestinal: Negative.   Endocrine: Negative.   Genitourinary: Negative.   Musculoskeletal: Negative.   Skin: Negative.   Allergic/Immunologic: Negative.   Neurological: Negative.   Hematological: Negative.   Psychiatric/Behavioral: Negative.   All other systems reviewed and are negative.      Objective:   Vitals:   12/20/18 1358  BP: 104/70  Pulse: 78  Resp: 18  Temp: 97.7 F (36.5 C)  TempSrc: Temporal  SpO2: 96%  Weight: 191 lb 9.6 oz (86.9 kg)  Height: 5\' 7"  (1.702 m)    Body mass index is 30.01 kg/m.  Physical Exam Vitals signs and nursing note reviewed.  Constitutional:      Appearance: He is well-developed.  HENT:     Head: Normocephalic and atraumatic.     Nose: Nose normal.  Eyes:     General:        Right eye: No discharge.        Left eye: No discharge.     Conjunctiva/sclera: Conjunctivae normal.  Neck:     Trachea: No tracheal deviation.  Cardiovascular:     Rate and Rhythm: Normal rate and regular rhythm.     Pulses: Normal pulses.     Heart sounds: Normal heart sounds. No murmur. No friction rub. No gallop.   Pulmonary:     Effort: Pulmonary effort is normal. No respiratory distress.     Breath sounds: Normal breath sounds. No stridor.  Abdominal:     General: Abdomen is flat. Bowel sounds are normal. There is no distension.     Tenderness: There is no abdominal tenderness. There is no right CVA tenderness.  Musculoskeletal: Normal range of motion.  Skin:    General: Skin is warm and dry.     Findings: No rash.  Neurological:     Mental Status:  He is alert.     Motor: No abnormal muscle tone.     Coordination: Coordination normal.  Psychiatric:        Mood and Affect: Mood  normal.        Behavior: Behavior normal.      Results for orders placed or performed in visit on 12/20/18  POCT urinalysis dipstick  Result Value Ref Range   Color, UA yellow    Clarity, UA clear    Glucose, UA Negative Negative   Bilirubin, UA neg    Ketones, UA neg    Spec Grav, UA 1.015 1.010 - 1.025   Blood, UA neg    pH, UA 6.0 5.0 - 8.0   Protein, UA Negative Negative   Urobilinogen, UA 0.2 0.2 or 1.0 E.U./dL   Nitrite, UA neg    Leukocytes, UA Negative Negative   Appearance     Odor          Assessment & Plan:      ICD-10-CM   1. Urinary tract infection without hematuria, site unspecified  N39.0 POCT urinalysis dipstick    Urine cytology ancillary only    Urine Culture  2. Contact with and (suspected) exposure to other bacterial communicable diseases  Z20.818 Urine cytology ancillary only    Urine Culture  3. Needs flu shot  Z23 Flu Vaccine QUAD 6+ mos PF IM (Fluarix Quad PF)    Unclear why the patient is here to be tested for it looks like his girlfriend is having recurrent infections with her sexual activity and her PCP encouraged him to come get tested he pulled up on his phone a text message from her with a specific bacteria.  Also unclear if she is having BV or yeast infection recurrent with UTI.  Neither of them have had any STDs and he does not wish to be tested for STDs today.  He has no symptoms whatsoever.  He deferred genital exam.  Urine dip was negative, cytology level be added trying to see if there is any yeast present in his urine and urine culture will be added to see if there is any bacteria, I did encourage him to have his girlfriend see OB/GYN who is very experienced with dealing with recurrent vaginal and urinary infections especially after sexual activity.    Delsa Grana, PA-C 12/20/18 2:23 PM

## 2018-12-25 ENCOUNTER — Encounter: Payer: Self-pay | Admitting: Family Medicine

## 2018-12-27 LAB — URINE CULTURE

## 2018-12-27 LAB — SPECIMEN STATUS REPORT

## 2018-12-30 ENCOUNTER — Encounter: Payer: Self-pay | Admitting: Family Medicine

## 2018-12-30 LAB — URINE CYTOLOGY ANCILLARY ONLY
Bacterial Vaginitis-Urine: NEGATIVE
Candida Urine: NEGATIVE
Chlamydia: NEGATIVE
Comment: NEGATIVE
Comment: NEGATIVE
Comment: NORMAL
Neisseria Gonorrhea: NEGATIVE
Trichomonas: NEGATIVE

## 2018-12-30 MED ORDER — CEPHALEXIN 500 MG PO CAPS: 500.0000 mg | ORAL_CAPSULE | Freq: Four times a day (QID) | ORAL | 0 refills | Status: AC

## 2018-12-30 NOTE — Addendum Note (Signed)
Addended by: Delsa Grana on: 12/30/2018 03:31 PM   Modules accepted: Orders

## 2019-01-01 ENCOUNTER — Encounter: Payer: 59 | Admitting: Family Medicine

## 2019-02-13 ENCOUNTER — Other Ambulatory Visit: Payer: Self-pay

## 2019-02-13 ENCOUNTER — Ambulatory Visit (INDEPENDENT_AMBULATORY_CARE_PROVIDER_SITE_OTHER): Payer: Managed Care, Other (non HMO) | Admitting: Family Medicine

## 2019-02-13 ENCOUNTER — Encounter: Payer: Self-pay | Admitting: Family Medicine

## 2019-02-13 ENCOUNTER — Other Ambulatory Visit (HOSPITAL_COMMUNITY)
Admission: RE | Admit: 2019-02-13 | Discharge: 2019-02-13 | Disposition: A | Discharge status: Home / Self Care | Payer: Managed Care, Other (non HMO) | Source: Ambulatory Visit | Attending: Family Medicine | Admitting: Family Medicine

## 2019-02-13 VITALS — BP 92/60 | HR 78 | Temp 96.9°F | Resp 16 | Ht 66.5 in | Wt 184.6 lb

## 2019-02-13 DIAGNOSIS — R7303 Prediabetes: Secondary | ICD-10-CM

## 2019-02-13 DIAGNOSIS — Z130 Encounter for screening for diseases of the blood and blood-forming organs and certain disorders involving the immune mechanism: Secondary | ICD-10-CM | POA: Diagnosis not present

## 2019-02-13 DIAGNOSIS — Z113 Encounter for screening for infections with a predominantly sexual mode of transmission: Secondary | ICD-10-CM | POA: Diagnosis present

## 2019-02-13 DIAGNOSIS — Z23 Encounter for immunization: Secondary | ICD-10-CM

## 2019-02-13 DIAGNOSIS — Z0000 Encounter for general adult medical examination without abnormal findings: Secondary | ICD-10-CM

## 2019-02-13 DIAGNOSIS — Z13220 Encounter for screening for lipoid disorders: Secondary | ICD-10-CM

## 2019-02-13 NOTE — Progress Notes (Signed)
Name: Ethan Dickerson.   MRN: [id number]    DOB: January 19, 1976   Date:02/13/2019       Progress Note  Subjective  Chief Complaint  Chief Complaint  Patient presents with  . 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   Depression: phq 9 is negative Depression screen [facility] 2/9 02/13/2019 12/20/2018 10/10/2017 10/10/2017 04/12/2017  Decreased Interest 0 0 0 0 0  Down, Depressed, Hopeless 0 0 0 0 0  PHQ - 2 Score 0 0 0 0 0  Altered sleeping 0 0 0 - -  Tired, decreased energy 0 0 2 - -  Change in appetite 0 0 0 - -  Feeling bad or failure about yourself  0 0 0 - -  Trouble concentrating 0 0 0 - -  Moving slowly or fidgety/restless 0 0 0 - -  Suicidal thoughts 0 0 0 - -  PHQ-9 Score 0 0 2 - -  Difficult doing work/chores - Not difficult at all Not difficult at all - -    Hypertension:  BP Readings from Last 3 Encounters:  02/13/19 92/60  12/20/18 104/70  10/10/17 106/62    Obesity: Wt Readings from Last 3 Encounters:  02/13/19 184 lb 9.6 oz (83.7 kg)  12/20/18 191 lb 9.6 oz (86.9 kg)  10/10/17 178 lb 8 oz (81 kg)   BMI Readings from Last 3 Encounters:  02/13/19 29.35 kg/m  12/20/18 30.01 kg/m  10/10/17 27.96 kg/m     Lipids:  Lab Results  Component Value Date   CHOL 186 04/12/2017   Lab Results  Component Value Date   HDL 54 04/12/2017   Lab Results  Component Value Date   LDLCALC 107 (H) 04/12/2017   Lab Results  Component Value Date   TRIG 132 04/12/2017   Lab Results  Component Value Date   CHOLHDL 3.4 04/12/2017   No results found for: LDLDIRECT Glucose:  Glucose, Bld  Date Value Ref Range Status  10/10/2017 106 65 - 139 mg/dL Final    Comment:    .        Non-fasting reference interval .   04/12/2017 85 65 - 139 mg/dL Final    Comment:    .        Non-fasting  reference interval .       [facility] Visit from 02/13/2019 in [facility]  AUDIT-C Score  0      Single STD testing and prevention (HIV/chl/gon/syphilis): today  Hep C: 03/2017  Skin cancer: Discussed monitoring for atypical lesions Colorectal cancer: start at 66  Prostate cancer: discussed USPTF   IPSS Questionnaire (AUA-7): Over the past month.   1)  How often have you had a sensation of not emptying your bladder completely after you finish urinating?  0 - Not at all  2)  How often have you had to urinate again less than two hours after you finished urinating? 0 - Not at all  3)  How often have you found you stopped and started again several times when you urinated?  0 - Not at all  4) How difficult have you found it to postpone urination?  0 - Not at all  5) How often have you had a weak urinary stream?  0 - Not  at all  6) How often have you had to push or strain to begin urination?  0 - Not at all  7) How many times did you most typically get up to urinate from the time you went to bed until the time you got up in the morning?  0 - None  Total score:  0-7 mildly symptomatic   8-19 moderately symptomatic   20-35 severely symptomatic    Lung cancer:   Low Dose CT Chest recommended if Age 23-80 years, 30 pack-year currently smoking OR have quit w/in 15years. Patient does not qualify.   ECG: 2017  Advanced Care Planning: A voluntary discussion about advance care planning including the explanation and discussion of advance directives.  Discussed health care proxy and Living will, and the patient was able to identify a health care proxy as aunt Adonis Huguenin .  Patient does not have a living will at present time.   Patient Active Problem List   Diagnosis Date Noted  . Hyperglycemia 04/14/2017  . Dyslipidemia 04/14/2017  . Chronic low back pain without sciatica 03/09/2015    Past Surgical History:  Procedure Laterality Date  . CHOLECYSTECTOMY  2004    Family  History  Problem Relation Age of Onset  . Diabetes Father   . Hypertension Father   . Alcohol abuse Father   . Alcohol abuse Mother   . Cirrhosis Mother   . Thyroid disease Sister   . Thyroid disease Sister   . Breast cancer Paternal Aunt     Social History   Socioeconomic History  . Marital status: Single    Spouse name: Not on file  . Number of children: 2  . Years of education: Not on file  . Highest education level: Associate degree: academic program  Occupational History  . Occupation: Scientific laboratory technician: [employer]    Comment: [employer]  Tobacco Use  . Smoking status: Never Smoker  . Smokeless tobacco: Never Used  Substance and Sexual Activity  . Alcohol use: No    Alcohol/week: 0.0 standard drinks  . Drug use: No  . Sexual activity: Yes    Partners: Female    Birth control/protection: None  Other Topics Concern  . 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 Difficulty of Paying Living Expenses: Not hard at all  Food Insecurity: No Food Insecurity  . Worried About Charity fundraiser in the Last Year: Never true  . Ran Out of Food in the Last Year: Never true  Transportation Needs: No Transportation Needs  . Lack of Transportation (Medical): No  . Lack of Transportation (Non-Medical): No  Physical Activity: Sufficiently Active  . Days of Exercise per Week: 5 days  . Minutes of Exercise per Session: 100 min  Stress: No Stress Concern Present  . Feeling of Stress : Not at all  Social Connections: Somewhat Isolated  . Frequency of Communication with Friends and Family: More than three times a week  . Frequency of Social Gatherings with Friends and Family: More than three times a week  . Attends  Religious Services: Never  . Active Member of Clubs or Organizations:  Yes  . Attends Club or Organization Meetings: More than 4 times per year  . Marital Status: Never married  Intimate Partner Violence: Not At Risk  . Fear of Current or Ex-Partner: No  . Emotionally Abused: No  . Physically Abused: No  . Sexually Abused: No     Current Outpatient Medications:  .  ibuprofen (ADVIL,MOTRIN) 800 MG tablet, Take 1 tablet (800 mg total) by mouth 3 (three) times daily., Disp: 90 tablet, Rfl: 0 .  Multiple Vitamins-Minerals (CENTRUM MEN PO), Take by mouth., Disp: , Rfl:   No Known Allergies   ROS  Constitutional: Negative for fever or weight change.  Respiratory: Negative for cough and shortness of breath.   Cardiovascular: Negative for chest pain or palpitations.  Gastrointestinal: Negative for abdominal pain, no bowel changes.  Musculoskeletal: Negative for gait problem or joint swelling.  Skin: Negative for rash.  Neurological: Negative for dizziness or headache.  No other specific complaints in a complete review of systems (except as listed in HPI above).   Objective  Vitals:   02/13/19 0850  BP: 92/60  Pulse: 78  Resp: 16  Temp: (!) 96.9 F (36.1 C)  TempSrc: Temporal  SpO2: 94%  Weight: 184 lb 9.6 oz (83.7 kg)  Height: 5' 6.5" (1.689 m)    Body mass index is 29.35 kg/m.  Physical Exam  Constitutional: Patient appears well-developed and well-nourished. No distress.  HENT: Head: Normocephalic and atraumatic. Ears: B TMs ok, no erythema or effusion; Nose: Nose normal. Mouth/Throat: Oropharynx is clear and moist. No oropharyngeal exudate.  Eyes: Conjunctivae and EOM are normal. Pupils are equal, round, and reactive to light. No scleral icterus.  Neck: Normal range of motion. Neck supple. No JVD present. No thyromegaly present.  Cardiovascular: Normal rate, regular rhythm and normal heart sounds.  No murmur heard. No BLE edema. Pulmonary/Chest: Effort normal and  breath sounds normal. No respiratory distress. Abdominal: Soft. Bowel sounds are normal, no distension. There is no tenderness. no masses MALE GENITALIA: Normal descended testes bilaterally, no masses palpated, no hernias, no lesions, no discharge RECTAL: not done Musculoskeletal: Normal range of motion, no joint effusions. No gross deformities Neurological: he is alert and oriented to person, place, and time. No cranial nerve deficit. Coordination, balance, strength, speech and gait are normal.  Skin: Skin is warm and dry. No rash noted. No erythema.  Psychiatric: Patient has a normal mood and affect. behavior is normal. Judgment and thought content normal.  Recent Results (from the past 2160 hour(s))  POCT urinalysis dipstick     Status: Normal   Collection Time: 12/20/18  2:05 PM  Result Value Ref Range   Color, UA yellow    Clarity, UA clear    Glucose, UA Negative Negative   Bilirubin, UA neg    Ketones, UA neg    Spec Grav, UA 1.015 1.010 - 1.025   Blood, UA neg    pH, UA 6.0 5.0 - 8.0   Protein, UA Negative Negative   Urobilinogen, UA 0.2 0.2 or 1.0 E.U./dL   Nitrite, UA neg    Leukocytes, UA Negative Negative   Appearance     Odor    Urine cytology ancillary only     Status: None   Collection Time: 12/20/18  2:28 PM  Result Value Ref Range   Trichomonas Negative    Chlamydia Negative    Neisseria Gonorrhea Negative    Bacterial Vaginitis-Urine Negative    Candida Urine Negative    Comment Normal  Reference Range Trichomonas - Negative    Comment Normal Reference Ranger Chlamydia - Negative    Comment      Normal Reference Range Neisseria Gonorrhea - Negative  Urine Culture     Status: Abnormal   Collection Time: 12/23/18 12:00 AM   Specimen: Urine   URINE  Result Value Ref Range   Urine Culture, Routine Final report (A)    Organism ID, Bacteria Enterococcus faecalis (A)     Comment: 10,000-25,000 colony forming units per mL   Antimicrobial Susceptibility Comment      Comment:       ** S = Susceptible; I = Intermediate; R = Resistant **                    P = Positive; N = Negative             MICS are expressed in micrograms per mL    Antibiotic                 RSLT#1    RSLT#2    RSLT#3    RSLT#4 Ciprofloxacin                  S Levofloxacin                   S Nitrofurantoin                 S Penicillin                     S Tetracycline                   R Vancomycin                     S   Specimen status report     Status: None   Collection Time: 12/23/18 12:00 AM  Result Value Ref Range   specimen status report Comment     Comment: Please note Please note The date and/or time of collection was not indicated on the requisition as required by state and federal law.  The date of receipt of the specimen was used as the collection date if not supplied.      PHQ2/9: Depression screen [facility] 2/9 02/13/2019 12/20/2018 10/10/2017 10/10/2017 04/12/2017  Decreased Interest 0 0 0 0 0  Down, Depressed, Hopeless 0 0 0 0 0  PHQ - 2 Score 0 0 0 0 0  Altered sleeping 0 0 0 - -  Tired, decreased energy 0 0 2 - -  Change in appetite 0 0 0 - -  Feeling bad or failure about yourself  0 0 0 - -  Trouble concentrating 0 0 0 - -  Moving slowly or fidgety/restless 0 0 0 - -  Suicidal thoughts 0 0 0 - -  PHQ-9 Score 0 0 2 - -  Difficult doing work/chores - Not difficult at all Not difficult at all - -     Fall Risk: Fall Risk  02/13/2019 12/20/2018 10/10/2017 04/12/2017 03/09/2015  Falls in the past year? 0 0 No No No  Number falls in past yr: 0 0 - - -  Injury with Fall? 0 0 - - -     Functional Status Survey: Is the patient deaf or have difficulty hearing?: No Does the patient have difficulty seeing, even when wearing glasses/contacts?: No Does the patient have difficulty concentrating, remembering, or making decisions?: No  Does the patient have difficulty walking or climbing stairs?: No Does the patient have difficulty dressing or bathing?: No Does  the patient have difficulty doing errands alone such as visiting a doctor's office or shopping?: No    Assessment & Plan  1. Encounter for routine history and physical exam for male  - Lipid panel - COMPLETE METABOLIC PANEL WITH GFR - CBC with Differential/Platelet  2. Lipid screening  - Lipid panel  3. Pre-diabetes  - Hemoglobin A1c  4. Screening for deficiency anemia   5. Routine screening for STI (sexually transmitted infection)  - RPR - HIV Antibody (routine testing w rflx) - GC Probe amplification, urine  6. Needs flu shot  - Flu Vaccine QUAD 6+ mos PF IM (Fluarix Quad PF)   -Prostate cancer screening and PSA options (with potential risks and benefits of testing vs not testing) were discussed along with recent recs/guidelines. -USPSTF grade A and B recommendations reviewed with patient; age-appropriate recommendations, preventive care, screening tests, etc discussed and encouraged; healthy living encouraged; see AVS for patient education given to patient -Discussed importance of 150 minutes of physical activity weekly, eat two servings of fish weekly, eat one serving of tree nuts ( cashews, pistachios, pecans, almonds.Marland Kitchen) every other day, eat 6 servings of fruit/vegetables daily and drink plenty of water and avoid sweet beverages.

## 2019-02-13 NOTE — Patient Instructions (Signed)
Preventive Care 41-43 Years Old, Male Preventive care refers to lifestyle choices and visits with your health care provider that can promote health and wellness. This includes:  A yearly physical exam. This is also called an annual well check.  Regular dental and eye exams.  Immunizations.  Screening for certain conditions.  Healthy lifestyle choices, such as eating a healthy diet, getting regular exercise, not using drugs or products that contain nicotine and tobacco, and limiting alcohol use. What can I expect for my preventive care visit? Physical exam Your health care provider will check:  Height and weight. These may be used to calculate body mass index (BMI), which is a measurement that tells if you are at a healthy weight.  Heart rate and blood pressure.  Your skin for abnormal spots. Counseling Your health care provider may ask you questions about:  Alcohol, tobacco, and drug use.  Emotional well-being.  Home and relationship well-being.  Sexual activity.  Eating habits.  Work and work Statistician. What immunizations do I need?  Influenza (flu) vaccine  This is recommended every year. Tetanus, diphtheria, and pertussis (Tdap) vaccine  You may need a Td booster every 10 years. Varicella (chickenpox) vaccine  You may need this vaccine if you have not already been vaccinated. Zoster (shingles) vaccine  You may need this after age 64. Measles, mumps, and rubella (MMR) vaccine  You may need at least one dose of MMR if you were born in 1957 or later. You may also need a second dose. Pneumococcal conjugate (PCV13) vaccine  You may need this if you have certain conditions and were not previously vaccinated. Pneumococcal polysaccharide (PPSV23) vaccine  You may need one or two doses if you smoke cigarettes or if you have certain conditions. Meningococcal conjugate (MenACWY) vaccine  You may need this if you have certain conditions. Hepatitis A  vaccine  You may need this if you have certain conditions or if you travel or work in places where you may be exposed to hepatitis A. Hepatitis B vaccine  You may need this if you have certain conditions or if you travel or work in places where you may be exposed to hepatitis B. Haemophilus influenzae type b (Hib) vaccine  You may need this if you have certain risk factors. Human papillomavirus (HPV) vaccine  If recommended by your health care provider, you may need three doses over 6 months. You may receive vaccines as individual doses or as more than one vaccine together in one shot (combination vaccines). Talk with your health care provider about the risks and benefits of combination vaccines. What tests do I need? Blood tests  Lipid and cholesterol levels. These may be checked every 5 years, or more frequently if you are over 60 years old.  Hepatitis C test.  Hepatitis B test. Screening  Lung cancer screening. You may have this screening every year starting at age 43 if you have a 30-pack-year history of smoking and currently smoke or have quit within the past 15 years.  Prostate cancer screening. Recommendations will vary depending on your family history and other risks.  Colorectal cancer screening. All adults should have this screening starting at age 72 and continuing until age 2. Your health care provider may recommend screening at age 14 if you are at increased risk. You will have tests every 1-10 years, depending on your results and the type of screening test.  Diabetes screening. This is done by checking your blood sugar (glucose) after you have not eaten  for a while (fasting). You may have this done every 1-3 years.  Sexually transmitted disease (STD) testing. Follow these instructions at home: Eating and drinking  Eat a diet that includes fresh fruits and vegetables, whole grains, lean protein, and low-fat dairy products.  Take vitamin and mineral supplements as  recommended by your health care provider.  Do not drink alcohol if your health care provider tells you not to drink.  If you drink alcohol: ? Limit how much you have to 0-2 drinks a day. ? Be aware of how much alcohol is in your drink. In the [place] one drink equals one 12 oz bottle of beer (355 mL), one 5 oz glass of wine (148 mL), or one 1 oz glass of hard liquor (44 mL). Lifestyle  Take daily care of your teeth and gums.  Stay active. Exercise for at least 30 minutes on 5 or more days each week.  Do not use any products that contain nicotine or tobacco, such as cigarettes, e-cigarettes, and chewing tobacco. If you need help quitting, ask your health care provider.  If you are sexually active, practice safe sex. Use a condom or other form of protection to prevent STIs (sexually transmitted infections).  Talk with your health care provider about taking a low-dose aspirin every day starting at age 53. What's next?  Go to your health care provider once a year for a well check visit.  Ask your health care provider how often you should have your eyes and teeth checked.  Stay up to date on all vaccines. This information is not intended to replace advice given to you by your health care provider. Make sure you discuss any questions you have with your health care provider. Document Revised: 01/10/2018 Document Reviewed: 01/10/2018 Elsevier Patient Education  2020 [employer].

## 2019-02-14 LAB — CBC WITH DIFFERENTIAL/PLATELET
Absolute Monocytes: 424 cells/uL (ref 200–950)
Basophils Absolute: 32 cells/uL (ref 0–200)
Basophils Relative: 0.6 %
Eosinophils Absolute: 159 cells/uL (ref 15–500)
Eosinophils Relative: 3 %
HCT: 47 % (ref 38.5–50.0)
Hemoglobin: 15.8 g/dL (ref 13.2–17.1)
Lymphs Abs: 2056 cells/uL (ref 850–3900)
MCH: 27.6 pg (ref 27.0–33.0)
MCHC: 33.6 g/dL (ref 32.0–36.0)
MCV: 82.2 fL (ref 80.0–100.0)
MPV: 10.2 fL (ref 7.5–12.5)
Monocytes Relative: 8 %
Neutro Abs: 2629 cells/uL (ref 1500–7800)
Neutrophils Relative %: 49.6 %
Platelets: 279 10*3/uL (ref 140–400)
RBC: 5.72 10*6/uL (ref 4.20–5.80)
RDW: 12.7 % (ref 11.0–15.0)
Total Lymphocyte: 38.8 %
WBC: 5.3 10*3/uL (ref 3.8–10.8)

## 2019-02-14 LAB — LIPID PANEL
Cholesterol: 172 mg/dL (ref <200)
HDL: 48 mg/dL (ref >=40)
LDL Cholesterol (Calc): 103 mg/dL (calc) — ABNORMAL HIGH
Non-HDL Cholesterol (Calc): 124 mg/dL (calc) (ref <130)
Total CHOL/HDL Ratio: 3.6 (calc) (ref <5.0)
Triglycerides: 113 mg/dL (ref <150)

## 2019-02-14 LAB — COMPLETE METABOLIC PANEL WITH GFR
AG Ratio: 1.7 (calc) (ref 1.0–2.5)
ALT: 28 U/L (ref 9–46)
AST: 19 U/L (ref 10–40)
Albumin: 4.4 g/dL (ref 3.6–5.1)
Alkaline phosphatase (APISO): 33 U/L — ABNORMAL LOW (ref 36–130)
BUN/Creatinine Ratio: 17 (calc) (ref 6–22)
BUN: 25 mg/dL (ref 7–25)
CO2: 31 mmol/L (ref 20–32)
Calcium: 9.8 mg/dL (ref 8.6–10.3)
Chloride: 105 mmol/L (ref 98–110)
Creat: 1.46 mg/dL — ABNORMAL HIGH (ref 0.60–1.35)
GFR, Est African American: 68 mL/min/{1.73_m2} (ref >=60)
GFR, Est Non African American: 58 mL/min/{1.73_m2} — ABNORMAL LOW (ref >=60)
Globulin: 2.6 g/dL (calc) (ref 1.9–3.7)
Glucose, Bld: 85 mg/dL (ref 65–99)
Potassium: 4.5 mmol/L (ref 3.5–5.3)
Sodium: 140 mmol/L (ref 135–146)
Total Bilirubin: 0.8 mg/dL (ref 0.2–1.2)
Total Protein: 7 g/dL (ref 6.1–8.1)

## 2019-02-14 LAB — HEMOGLOBIN A1C
Hgb A1c MFr Bld: 5.8 % of total Hgb — ABNORMAL HIGH (ref <5.7)
Mean Plasma Glucose: 120 (calc)
eAG (mmol/L): 6.6 (calc)

## 2019-02-14 LAB — RPR: RPR Ser Ql: NONREACTIVE

## 2019-02-14 LAB — HIV ANTIBODY (ROUTINE TESTING W REFLEX): HIV 1&2 Ab, 4th Generation: NONREACTIVE

## 2019-02-17 ENCOUNTER — Telehealth: Payer: Self-pay

## 2019-02-17 DIAGNOSIS — Z113 Encounter for screening for infections with a predominantly sexual mode of transmission: Secondary | ICD-10-CM

## 2019-02-17 NOTE — Telephone Encounter (Signed)
Orders are incorrect.

## 2019-02-17 NOTE — Telephone Encounter (Signed)
The correct order has been placed and signed in this encounter. It is [id number]. Has been sent back to cytology and needs to be co-signed.

## 2019-02-18 LAB — URINE CYTOLOGY ANCILLARY ONLY
Chlamydia: NEGATIVE
Comment: NEGATIVE
Comment: NORMAL
Neisseria Gonorrhea: NEGATIVE

## 2020-02-07 IMAGING — MR MR STERNUM W/O CM
6 series · 16 of 16 positions shown · non-contrast
Comparison: Sternal x-rays dated April 16, 2017.

CLINICAL DATA: Painful knot near the xiphoid process for the past
4-6 months. No history of trauma.

EXAM:
MRI STERNUM WITHOUT CONTRAST
TECHNIQUE: Multiplanar multisequence MR imaging of the sternum was performed.
No intravenous contrast was administered.

[Series 3: T1 · axial · 5.0mm · 0.78mm/px · z∈[-117,+111]mm · 4 of 40 slices shown (1 of 2)]
[im 1/40]
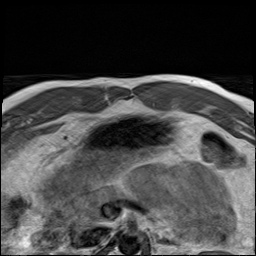
[im 14/40]
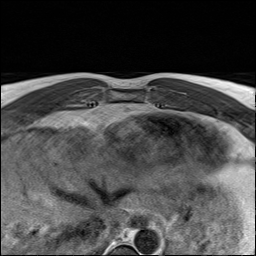
[im 27/40]
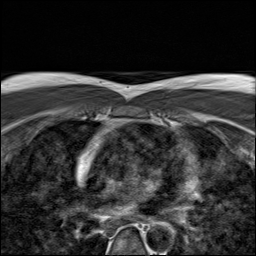
[im 40/40]
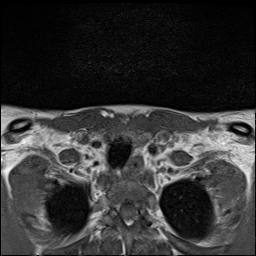

[Series 4: T2 fat-sat · axial · 5.0mm · 0.78mm/px · z∈[-117,+111]mm · 3 of 40 slices shown]
[im 1/40]
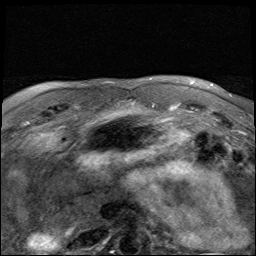
[im 20/40]
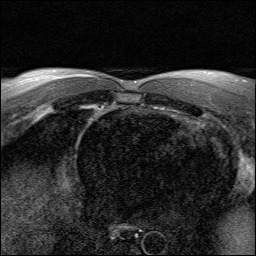
[im 40/40]
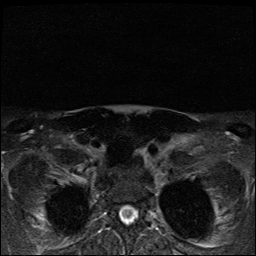

[Series 5: STIR · axial · 5.0mm · 0.78mm/px · z∈[-117,+111]mm · 3 of 40 slices shown (1 of 2)]
[im 1/40]
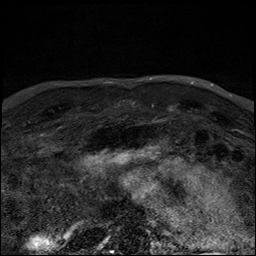
[im 20/40]
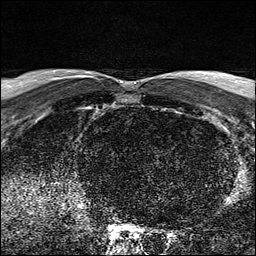
[im 40/40]
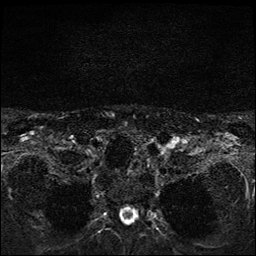

[Series 6: T1 · coronal · 4.0mm · 1.17mm/px · 2 of 19 slices shown (2 of 2)]
[im 1/19]
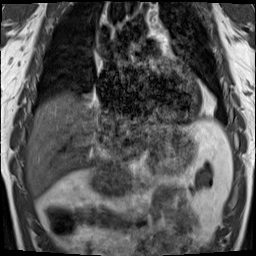
[im 19/19]
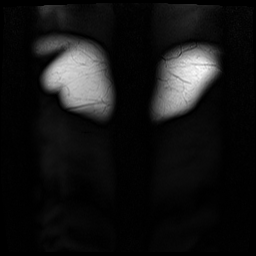

[Series 7: PD fat-sat · sagittal · 4.0mm · 1.17mm/px · 2 of 25 slices shown]
[im 1/25]
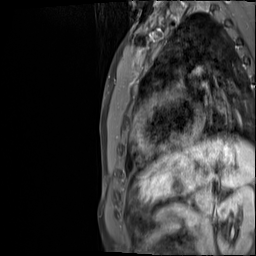
[im 25/25]
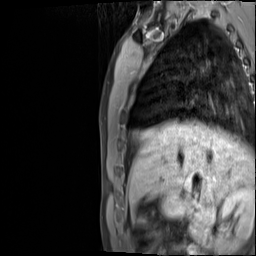

[Series 8: STIR · coronal · 4.0mm · 1.17mm/px · 2 of 20 slices shown (2 of 2)]
[im 1/20]
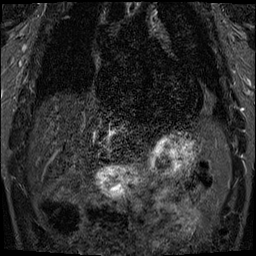
[im 20/20]
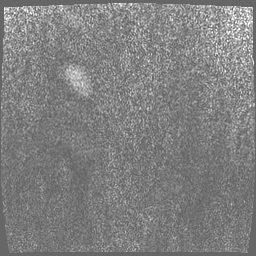

[16 of 16 positions shown; findings below may reference images not displayed]

FINDINGS: Bones/Joint/Cartilage

No suspicious marrow signal abnormality. No focal bone lesion. No
acute fracture or dislocation. The sternoclavicular joints are
unremarkable.

Muscles and Tendons

No intercostal or pectoralis muscle edema or atrophy..

Soft tissues

No soft tissue mass or fluid collection. 1.7 cm cyst in the liver.
The visualized intrathoracic and intra-abdominal contents are
otherwise unremarkable.
IMPRESSION: 1. No acute osseous or soft tissue abnormality near the xiphoid
process. No explanation for the patient's symptoms.

## 2020-02-17 ENCOUNTER — Encounter: Payer: Managed Care, Other (non HMO) | Admitting: Family Medicine

## 2020-12-01 ENCOUNTER — Ambulatory Visit (INDEPENDENT_AMBULATORY_CARE_PROVIDER_SITE_OTHER): Payer: Managed Care, Other (non HMO) | Admitting: Family Medicine

## 2020-12-01 ENCOUNTER — Other Ambulatory Visit: Payer: Self-pay

## 2020-12-01 ENCOUNTER — Encounter: Payer: Self-pay | Admitting: Family Medicine

## 2020-12-01 VITALS — BP 116/82 | HR 78 | Temp 97.8°F | Resp 16 | Ht 67.0 in | Wt 189.0 lb

## 2020-12-01 DIAGNOSIS — M549 Dorsalgia, unspecified: Secondary | ICD-10-CM | POA: Diagnosis not present

## 2020-12-01 DIAGNOSIS — R7303 Prediabetes: Secondary | ICD-10-CM | POA: Diagnosis not present

## 2020-12-01 DIAGNOSIS — Z23 Encounter for immunization: Secondary | ICD-10-CM | POA: Diagnosis not present

## 2020-12-01 DIAGNOSIS — M25562 Pain in left knee: Secondary | ICD-10-CM

## 2020-12-01 DIAGNOSIS — G8929 Other chronic pain: Secondary | ICD-10-CM

## 2020-12-01 DIAGNOSIS — R7989 Other specified abnormal findings of blood chemistry: Secondary | ICD-10-CM | POA: Diagnosis not present

## 2020-12-01 DIAGNOSIS — M25561 Pain in right knee: Secondary | ICD-10-CM

## 2020-12-01 MED ORDER — DICLOFENAC SODIUM 1 % EX GEL
2.0000 g | Freq: Four times a day (QID) | CUTANEOUS | 5 refills | Status: DC
Start: 2020-12-01 — End: 2023-11-21

## 2020-12-01 MED ORDER — DICLOFENAC SODIUM 1 % EX GEL: 2.0000 g | Freq: Four times a day (QID) | CUTANEOUS | 5 refills | Status: DC

## 2020-12-01 NOTE — Progress Notes (Signed)
Name: Ethan Dickerson.   MRN: [id number]    DOB: [birth year]   Date:12/01/2020       Progress Note  Subjective  Chief Complaint  Follow up/Creatinine  HPI  Elevated Creatinine: he went to have life insurance labs done and his creatinine was high and was given a higher premium. Reviewed old labs with patient GFR for AA was normal, explained that GFR now is the same for everyone , but his Creatinine likely elevated because he is muscular. He has normal urine output. He states he stopped drinking [place], he is not taking protein supplements but has a low carb diet rich in lean meat.   Hyperglycemia/pre diabetes: avoiding a lot of starches, we will recheck A1C Denies polyphagia, polydipsia or polyuria   Chronic low back pain: seeing chiropractor and has been doing well, states no longer having radiculitis   Chronic knee pain: he played multiple sports all his life and has noticed bilateral knee pain, no effusion or redness, pain is daily but intermittent , usually when squatting. Discussed stretching and also try topical medication like biofreeze and voltaren gel    Patient Active Problem List   Diagnosis Date Noted   Hyperglycemia 04/14/2017   Dyslipidemia 04/14/2017   Chronic low back pain without sciatica 03/09/2015    Past Surgical History:  Procedure Laterality Date   CHOLECYSTECTOMY  2004    Family History  Problem Relation Age of Onset   Diabetes Father    Hypertension Father    Alcohol abuse Father    Alcohol abuse Mother    Cirrhosis Mother    Thyroid disease Sister    Thyroid disease Sister    Breast cancer Paternal Aunt     Social History   Tobacco Use   Smoking status: Never   Smokeless tobacco: Never  Substance Use Topics   Alcohol use: No    Alcohol/week: 0.0 standard drinks     Current Outpatient Medications:    ibuprofen (ADVIL,MOTRIN) 800 MG tablet, Take 1 tablet (800 mg total) by mouth 3 (three) times daily., Disp: 90 tablet, Rfl: 0   Multiple  Vitamins-Minerals (CENTRUM MEN PO), Take by mouth., Disp: , Rfl:   No Known Allergies  I personally reviewed active problem list, medication list, allergies, family history, social history with the patient/caregiver today.   ROS  Constitutional: Negative for fever or weight change.  Respiratory: Negative for cough and shortness of breath.   Cardiovascular: Negative for chest pain or palpitations.  Gastrointestinal: Negative for abdominal pain, no bowel changes.  Musculoskeletal: Negative for gait problem or joint swelling.  Skin: Negative for rash.  Neurological: Negative for dizziness or headache.  No other specific complaints in a complete review of systems (except as listed in HPI above).   Objective  Vitals:   12/01/20 1504  BP: 116/82  Pulse: 78  Resp: 16  Temp: 97.8 F (36.6 C)  SpO2: 98%  Weight: 189 lb (85.7 kg)  Height: 5\' 7"  (1.702 m)    Body mass index is 29.6 kg/m.  Physical Exam  Constitutional: Patient appears well-developed and well-nourished. Muscular  No distress.  HEENT: head atraumatic, normocephalic, pupils equal and reactive to light, neck supple Cardiovascular: Normal rate, regular rhythm and normal heart sounds.  No murmur heard. No BLE edema. Pulmonary/Chest: Effort normal and breath sounds normal. No respiratory distress. Abdominal: Soft.  There is no tenderness. Muscular skeletal: normal rom of both knees, normal rom of spine Psychiatric: Patient has a normal mood and affect.  behavior is normal. Judgment and thought content normal.   PHQ2/9: Depression screen [facility] 2/9 12/01/2020 02/13/2019 12/20/2018 10/10/2017 10/10/2017  Decreased Interest 0 0 0 0 0  Down, Depressed, Hopeless 0 0 0 0 0  PHQ - 2 Score 0 0 0 0 0  Altered sleeping 0 0 0 0 -  Tired, decreased energy 0 0 0 2 -  Change in appetite 0 0 0 0 -  Feeling bad or failure about yourself  0 0 0 0 -  Trouble concentrating 0 0 0 0 -  Moving slowly or fidgety/restless 0 0 0 0 -  Suicidal  thoughts 0 0 0 0 -  PHQ-9 Score 0 0 0 2 -  Difficult doing work/chores - - Not difficult at all Not difficult at all -    phq 9 is negative   Fall Risk: Fall Risk  12/01/2020 02/13/2019 12/20/2018 10/10/2017 04/12/2017  Falls in the past year? 0 0 0 No No  Number falls in past yr: 0 0 0 - -  Injury with Fall? 0 0 0 - -  Risk for fall due to : No Fall Risks - - - -  Follow up Falls prevention discussed - - - -      Functional Status Survey: Is the patient deaf or have difficulty hearing?: No Does the patient have difficulty seeing, even when wearing glasses/contacts?: No Does the patient have difficulty concentrating, remembering, or making decisions?: No Does the patient have difficulty walking or climbing stairs?: No Does the patient have difficulty dressing or bathing?: No Does the patient have difficulty doing errands alone such as visiting a doctor's office or shopping?: No    Assessment & Plan  1. Elevated serum creatinine  - BASIC METABOLIC PANEL WITH GFR - Microalbumin / creatinine urine ratio  2. Pre-diabetes  - HgB A1c  3. Need for immunization against influenza  - Flu Vaccine QUAD 6+ mos PF IM (Fluarix Quad PF)   4. Chronic high back pain  Seeing [employer] [employer] and doing well now   5. Chronic pain of both knees  - diclofenac Sodium (VOLTAREN) 1 % GEL; Apply 2 g topically 4 (four) times daily.  Dispense: 100 g; Refill: 5

## 2020-12-02 ENCOUNTER — Encounter: Payer: Self-pay | Admitting: Family Medicine

## 2020-12-02 LAB — BASIC METABOLIC PANEL WITH GFR
BUN/Creatinine Ratio: 17 (calc) (ref 6–22)
BUN: 26 mg/dL — ABNORMAL HIGH (ref 7–25)
CO2: 27 mmol/L (ref 20–32)
Calcium: 9.3 mg/dL (ref 8.6–10.3)
Chloride: 105 mmol/L (ref 98–110)
Creat: 1.49 mg/dL — ABNORMAL HIGH (ref 0.60–1.29)
Glucose, Bld: 80 mg/dL (ref 65–99)
Potassium: 4.4 mmol/L (ref 3.5–5.3)
Sodium: 140 mmol/L (ref 135–146)
eGFR: 59 mL/min/{1.73_m2} — ABNORMAL LOW (ref >=60)

## 2020-12-02 LAB — MICROALBUMIN / CREATININE URINE RATIO
Creatinine, Urine: 61 mg/dL (ref 20–320)
Microalb, Ur: <0.2 mg/dL

## 2020-12-02 LAB — HEMOGLOBIN A1C
Hgb A1c MFr Bld: 5.8 % of total Hgb — ABNORMAL HIGH (ref <5.7)
Mean Plasma Glucose: 120 mg/dL
eAG (mmol/L): 6.6 mmol/L

## 2020-12-03 ENCOUNTER — Other Ambulatory Visit: Payer: Self-pay | Admitting: Family Medicine

## 2020-12-03 DIAGNOSIS — N1831 Chronic kidney disease, stage 3a: Secondary | ICD-10-CM

## 2021-01-12 ENCOUNTER — Other Ambulatory Visit: Payer: Self-pay | Admitting: Nephrology

## 2021-01-12 DIAGNOSIS — R8290 Unspecified abnormal findings in urine: Secondary | ICD-10-CM

## 2021-01-12 DIAGNOSIS — N1831 Chronic kidney disease, stage 3a: Secondary | ICD-10-CM | POA: Insufficient documentation

## 2021-01-20 ENCOUNTER — Other Ambulatory Visit: Payer: Self-pay

## 2021-01-20 ENCOUNTER — Ambulatory Visit
Admission: RE | Admit: 2021-01-20 | Discharge: 2021-01-20 | Disposition: A | Discharge status: Home / Self Care | Payer: Managed Care, Other (non HMO) | Source: Ambulatory Visit | Attending: Nephrology | Admitting: Nephrology

## 2021-01-20 DIAGNOSIS — N1831 Chronic kidney disease, stage 3a: Secondary | ICD-10-CM | POA: Diagnosis present

## 2021-01-20 DIAGNOSIS — R8290 Unspecified abnormal findings in urine: Secondary | ICD-10-CM | POA: Diagnosis not present

## 2021-06-03 ENCOUNTER — Encounter: Payer: Managed Care, Other (non HMO) | Admitting: Family Medicine

## 2021-08-22 ENCOUNTER — Telehealth: Payer: 59 | Admitting: Nurse Practitioner

## 2021-08-22 DIAGNOSIS — L309 Dermatitis, unspecified: Secondary | ICD-10-CM

## 2021-08-22 MED ORDER — PREDNISONE 10 MG PO TABS: ORAL_TABLET | ORAL | 0 refills | Status: DC

## 2021-08-22 NOTE — Progress Notes (Signed)
Virtual Visit Consent   Ethan Dickerson., you are scheduled for a virtual visit with a [facility] provider today. Just as with appointments in the office, your consent must be obtained to participate. Your consent will be active for this visit and any virtual visit you may have with one of our providers in the next 365 days. If you have a MyChart account, a copy of this consent can be sent to you electronically.  As this is a virtual visit, video technology does not allow for your provider to perform a traditional examination. This may limit your provider's ability to fully assess your condition. If your provider identifies any concerns that need to be evaluated in person or the need to arrange testing (such as labs, EKG, etc.), we will make arrangements to do so. Although advances in technology are sophisticated, we cannot ensure that it will always work on either your end or our end. If the connection with a video visit is poor, the visit may have to be switched to a telephone visit. With either a video or telephone visit, we are not always able to ensure that we have a secure connection.  By engaging in this virtual visit, you consent to the provision of healthcare and authorize for your insurance to be billed (if applicable) for the services provided during this visit. Depending on your insurance coverage, you may receive a charge related to this service.  I need to obtain your verbal consent now. Are you willing to proceed with your visit today? Ethan Dickerson. has provided verbal consent on 08/22/2021 for a virtual visit (video or telephone). Apolonio Schneiders, FNP  Date: 08/22/2021 11:43 AM  Virtual Visit via Video Note   I, Apolonio Schneiders, connected with  Ethan Dickerson.  ([id number], Jan 12, 1976) on 08/22/21 at 11:45 AM EDT by a video-enabled telemedicine application and verified that I am speaking with the correct person using two identifiers.  Location: Patient: Virtual Visit Location  Patient: Home Provider: Virtual Visit Location Provider: Home Office   I discussed the limitations of evaluation and management by telemedicine and the availability of in person appointments. The patient expressed understanding and agreed to proceed.    History of Present Illness: Ethan Dickerson. is a 46 y.o. who identifies as a male who was assigned male at birth, and is being seen today with complaints of a rash that started on his forehead and has since spread. He has spots on his upper arm, on his hand, on his neck.  Rash onset was 7/12.   He was at home when he first noticed the rash.  Prior to rash onset he had not traveled or stayed outside of his house.   Prior to onset he had cut the grass and had a haircut.   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   The rash is itchy throughout the day not necessarily more at night.   Possible contact with poison ivy but not known.   Has also experienced over the past 2 years intense itching after taking a shower, he uses dove sensitive soap.   Does not experience a rash after showering just itching.  Takes Zyrtec on occasion  Denies diagnosis with eczema in the past   Denies any known contacts with other persons with rash   Problems:  Patient Active Problem List   Diagnosis Date Noted   Hyperglycemia 04/14/2017   Dyslipidemia 04/14/2017   Chronic low back pain  without sciatica 03/09/2015    Allergies: No Known Allergies Medications:  Current Outpatient Medications:    diclofenac Sodium (VOLTAREN) 1 % GEL, Apply 2 g topically 4 (four) times daily., Disp: 100 g, Rfl: 5   ibuprofen (ADVIL,MOTRIN) 800 MG tablet, Take 1 tablet (800 mg total) by mouth 3 (three) times daily., Disp: 90 tablet, Rfl: 0   Multiple Vitamins-Minerals (CENTRUM MEN PO), Take by mouth., Disp: , Rfl:   Observations/Objective: Patient is well-developed, well-nourished in no acute distress.  Resting comfortably  at home.  Head is normocephalic,  atraumatic.  No labored breathing.  Speech is clear and coherent with logical content.  Patient is alert and oriented at baseline.  Cluster of papules to forehead, left upper arm, right hand middle finger and neck.   Areas appear raised, slight erythema, dry with skin intact  Surrounding skin appears normal   Rashes are in clusters and not linear in appearance.   Denies changes in soaps, detergents or other topicals   Uses Aveeno sensitive gel moisturizer     Assessment and Plan: 1. Dermatitis Meds ordered this encounter  Medications   predniSONE (DELTASONE) 10 MG tablet    Sig: Take 4 tablets ('40mg'$ ) on days 1-4, then 3 tablets ('30mg'$ ) on days 5-8, then 2 tablets ('20mg'$ ) on days 9-11, then 1 tablet daily for days 12-14. Take with food.    Dispense:  37 tablet    Refill:  0    Advised follow up with dermatology or allergist for workup  Encouraged zyrtec daily Avoid long hot showers  Continue use of dove soap  Encouraged topical ointment like Aquaphor for moisturizing      Follow Up Instructions: I discussed the assessment and treatment plan with the patient. The patient was provided an opportunity to ask questions and all were answered. The patient agreed with the plan and demonstrated an understanding of the instructions.  A copy of instructions were sent to the patient via MyChart unless otherwise noted below.   The patient was advised to call back or seek an in-person evaluation if the symptoms worsen or if the condition fails to improve as anticipated.  Time:  I spent 20 minutes with the patient via telehealth technology discussing the above problems/concerns.    Apolonio Schneiders, FNP

## 2021-09-20 NOTE — Patient Instructions (Incomplete)
Preventive Care 50-45 Years Old, Male Preventive care refers to lifestyle choices and visits with your health care provider that can promote health and wellness. Preventive care visits are also called wellness exams. What can I expect for my preventive care visit? Counseling During your preventive care visit, your health care provider may ask about your: Medical history, including: Past medical problems. Family medical history. Current health, including: Emotional well-being. Home life and relationship well-being. Sexual activity. Lifestyle, including: Alcohol, nicotine or tobacco, and drug use. Access to firearms. Diet, exercise, and sleep habits. Safety issues such as seatbelt and bike helmet use. Sunscreen use. Work and work Statistician. Physical exam Your health care provider will check your: Height and weight. These may be used to calculate your BMI (body mass index). BMI is a measurement that tells if you are at a healthy weight. Waist circumference. This measures the distance around your waistline. This measurement also tells if you are at a healthy weight and may help predict your risk of certain diseases, such as type 2 diabetes and high blood pressure. Heart rate and blood pressure. Body temperature. Skin for abnormal spots. What immunizations do I need?  Vaccines are usually given at various ages, according to a schedule. Your health care provider will recommend vaccines for you based on your age, medical history, and lifestyle or other factors, such as travel or where you work. What tests do I need? Screening Your health care provider may recommend screening tests for certain conditions. This may include: Lipid and cholesterol levels. Diabetes screening. This is done by checking your blood sugar (glucose) after you have not eaten for a while (fasting). Hepatitis B test. Hepatitis C test. HIV (human immunodeficiency virus) test. STI (sexually transmitted infection)  testing, if you are at risk. Lung cancer screening. Prostate cancer screening. Colorectal cancer screening. Talk with your health care provider about your test results, treatment options, and if necessary, the need for more tests. Follow these instructions at home: Eating and drinking  Eat a diet that includes fresh fruits and vegetables, whole grains, lean protein, and low-fat dairy products. Take vitamin and mineral supplements as recommended by your health care provider. Do not drink alcohol if your health care provider tells you not to drink. If you drink alcohol: Limit how much you have to 0-2 drinks a day. Know how much alcohol is in your drink. In the [place] one drink equals one 12 oz bottle of beer (355 mL), one 5 oz glass of wine (148 mL), or one 1 oz glass of hard liquor (44 mL). Lifestyle Brush your teeth every morning and night with fluoride toothpaste. Floss one time each day. Exercise for at least 30 minutes 5 or more days each week. Do not use any products that contain nicotine or tobacco. These products include cigarettes, chewing tobacco, and vaping devices, such as e-cigarettes. If you need help quitting, ask your health care provider. Do not use drugs. If you are sexually active, practice safe sex. Use a condom or other form of protection to prevent STIs. Take aspirin only as told by your health care provider. Make sure that you understand how much to take and what form to take. Work with your health care provider to find out whether it is safe and beneficial for you to take aspirin daily. Find healthy ways to manage stress, such as: Meditation, yoga, or listening to music. Journaling. Talking to a trusted person. Spending time with friends and family. Minimize exposure to UV radiation to reduce  your risk of skin cancer. ?Safety ?Always wear your seat belt while driving or riding in a vehicle. ?Do not drive: ?If you have been drinking alcohol. Do not ride with someone who  has been drinking. ?When you are tired or distracted. ?While texting. ?If you have been using any mind-altering substances or drugs. ?Wear a helmet and other protective equipment during sports activities. ?If you have firearms in your house, make sure you follow all gun safety procedures. ?What's next? ?Go to your health care provider once a year for an annual wellness visit. ?Ask your health care provider how often you should have your eyes and teeth checked. ?Stay up to date on all vaccines. ?This information is not intended to replace advice given to you by your health care provider. Make sure you discuss any questions you have with your health care provider. ?Document Revised: 07/14/2020 Document Reviewed: 07/14/2020 ?Elsevier Patient Education ? 2023 [employer] ? ?

## 2021-09-20 NOTE — Progress Notes (Unsigned)
Name: Ethan Dickerson.   MRN: [id number]    DOB: [birth year]   Date:09/21/2021       Progress Note  Subjective  Chief Complaint  Annual Exam  HPI  Patient presents for annual CPE.  IPSS Questionnaire (AUA-7): Over the past month.   1)  How often have you had a sensation of not emptying your bladder completely after you finish urinating?  0 - Not at all  2)  How often have you had to urinate again less than two hours after you finished urinating? 0 - Not at all  3)  How often have you found you stopped and started again several times when you urinated?  0 - Not at all  4) How difficult have you found it to postpone urination?  0 - Not at all  5) How often have you had a weak urinary stream?  0 - Not at all  6) How often have you had to push or strain to begin urination?  0 - Not at all  7) How many times did you most typically get up to urinate from the time you went to bed until the time you got up in the morning?  0 - None  Total score:  0-7 mildly symptomatic   8-19 moderately symptomatic   20-35 severely symptomatic     Diet: balanced  Exercise: continue regular physical activity  Last Dental Exam: he goes twice a year  Last Eye Exam: he goes yearly   Depression: phq 9 is negative    09/21/2021    1:12 PM 12/01/2020    3:04 PM 02/13/2019    8:49 AM 12/20/2018    2:02 PM 10/10/2017    8:24 AM  Depression screen PHQ 2/9  Decreased Interest 0 0 0 0 0  Down, Depressed, Hopeless 0 0 0 0 0  PHQ - 2 Score 0 0 0 0 0  Altered sleeping 0 0 0 0 0  Tired, decreased energy 0 0 0 0 2  Change in appetite 0 0 0 0 0  Feeling bad or failure about yourself  0 0 0 0 0  Trouble concentrating 0 0 0 0 0  Moving slowly or fidgety/restless 0 0 0 0 0  Suicidal thoughts 0 0 0 0 0  PHQ-9 Score 0 0 0 0 2  Difficult doing work/chores    Not difficult at all Not difficult at all    Hypertension:  BP Readings from Last 3 Encounters:  09/21/21 134/76  12/01/20 116/82  02/13/19 92/60     Obesity: Wt Readings from Last 3 Encounters:  09/21/21 187 lb (84.8 kg)  12/01/20 189 lb (85.7 kg)  02/13/19 184 lb 9.6 oz (83.7 kg)   BMI Readings from Last 3 Encounters:  09/21/21 29.29 kg/m  12/01/20 29.60 kg/m  02/13/19 29.35 kg/m     Lipids:  Lab Results  Component Value Date   CHOL 172 02/13/2019   CHOL 186 04/12/2017   Lab Results  Component Value Date   HDL 48 02/13/2019   HDL 54 04/12/2017   Lab Results  Component Value Date   LDLCALC 103 (H) 02/13/2019   LDLCALC 107 (H) 04/12/2017   Lab Results  Component Value Date   TRIG 113 02/13/2019   TRIG 132 04/12/2017   Lab Results  Component Value Date   CHOLHDL 3.6 02/13/2019   CHOLHDL 3.4 04/12/2017   No results found for: "LDLDIRECT" Glucose:  Glucose, Bld  Date Value Ref Range Status  12/01/2020 80 65 - 99 mg/dL Final    Comment:    .            Fasting reference interval .   02/13/2019 85 65 - 99 mg/dL Final    Comment:    .            Fasting reference interval .   10/10/2017 106 65 - 139 mg/dL Final    Comment:    .        Non-fasting reference interval .     [place] Office Visit from 02/13/2019 in [facility]  AUDIT-C Score 0       Single STD testing and prevention (HIV/chl/gon/syphilis): 02/13/19 Sexual history: same sexual partner for over one year Hep C Screening: 04/12/17 Skin cancer: Discussed monitoring for atypical lesions Colorectal cancer: referral placed to GI, discussed options  Prostate cancer:  discussed USPTF  ECG:  03/09/15  Vaccines:   HPV: N/A Tdap: up to date Shingrix: N/A Pneumonia: N/A Flu: today  COVID-19: he is not interested on booster   Advanced Care Planning: A voluntary discussion about advance care planning including the explanation and discussion of advance directives.  Discussed health care proxy and Living will, and the patient was able to identify a health care proxy as aunt Adonis Huguenin .  Patient does not have a  living will and power of attorney of health care   Patient Active Problem List   Diagnosis Date Noted   Chronic kidney disease, stage 3a ([place]) 01/12/2021   Hyperglycemia 04/14/2017   Dyslipidemia 04/14/2017   Chronic low back pain without sciatica 03/09/2015    Past Surgical History:  Procedure Laterality Date   CHOLECYSTECTOMY  2004    Family History  Problem Relation Age of Onset   Diabetes Father    Hypertension Father    Alcohol abuse Father    Alcohol abuse Mother    Cirrhosis Mother    Thyroid disease Sister    Thyroid disease Sister    Breast cancer Paternal Aunt     Social History   Socioeconomic History   Marital status: Single    Spouse name: Not on file   Number of children: 2   Years of education: Not on file   Highest education level: Associate degree: academic program  Occupational History   Occupation: Scientific laboratory technician: [employer]    Comment: [employer]  Tobacco Use   Smoking status: Never   Smokeless tobacco: Never  Vaping Use   Vaping Use: Never used  Substance and Sexual Activity   Alcohol use: No    Alcohol/week: 0.0 standard drinks of alcohol   Drug use: No   Sexual activity: Yes    Partners: Female    Birth control/protection: None  Other Topics Concern   Not on file  Social History Narrative   Works full time , 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    He has two children, older one son and one daughter    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[employer]    Also is a Product manager for young boys    Social Determinants of Health   Financial Resource Strain: Low Risk  (09/21/2021)   Overall Financial Resource Strain (CARDIA)    Difficulty of Paying Living Expenses: Not hard at all  Food Insecurity: No Food Insecurity (09/21/2021)   Hunger Vital Sign    Worried About Running Out of Food in the Last Year: Never true  Ran Out of Food in the Last Year: Never true  Transportation Needs: No Transportation Needs (09/21/2021)   PRAPARE - Armed forces logistics/support/administrative officer (Medical): No    Lack of Transportation (Non-Medical): No  Physical Activity: Sufficiently Active (09/21/2021)   Exercise Vital Sign    Days of Exercise per Week: 6 days    Minutes of Exercise per Session: 90 min  Stress: No Stress Concern Present (09/21/2021)   [place]    Feeling of Stress : Not at all  Social Connections: Moderately Isolated (09/21/2021)   Social Connection and Isolation Panel [NHANES]    Frequency of Communication with Friends and Family: More than three times a week    Frequency of Social Gatherings with Friends and Family: Once a week    Attends Religious Services: Never    Marine scientist or Organizations: Yes    Attends Music therapist: More than 4 times per year    Marital Status: Never married  Intimate Partner Violence: Not At Risk (09/21/2021)   Humiliation, Afraid, Rape, and Kick questionnaire    Fear of Current or Ex-Partner: No    Emotionally Abused: No    Physically Abused: No    Sexually Abused: No     Current Outpatient Medications:    diclofenac Sodium (VOLTAREN) 1 % GEL, Apply 2 g topically 4 (four) times daily., Disp: 100 g, Rfl: 5   Multiple Vitamins-Minerals (CENTRUM MEN PO), Take by mouth., Disp: , Rfl:   No Known Allergies   ROS  Constitutional: Negative for fever or weight change.  Respiratory: Negative for cough and shortness of breath.   Cardiovascular: Negative for chest pain or palpitations.  Gastrointestinal: Negative for abdominal pain, no bowel changes.  Musculoskeletal: Negative for gait problem or joint swelling.  Skin: Negative for rash.  Neurological: Negative for dizziness or headache.  No other specific complaints in a complete review of systems (except as listed in HPI above).    Objective  Vitals:   09/21/21 1319  BP: 134/76  Pulse: 67  Resp: 16  SpO2: 99%  Weight: 187 lb (84.8 kg)  Height: '5\' 7"'$   (1.702 m)    Body mass index is 29.29 kg/m.  Physical Exam  Constitutional: Patient appears well-developed and well-nourished. No distress.  HENT: Head: Normocephalic and atraumatic. Ears: B TMs ok, no erythema or effusion; Nose: Nose normal. Mouth/Throat: Oropharynx is clear and moist. No oropharyngeal exudate.  Eyes: Conjunctivae and EOM are normal. Pupils are equal, round, and reactive to light. No scleral icterus.  Neck: Normal range of motion. Neck supple. No JVD present. No thyromegaly present.  Cardiovascular: Normal rate, regular rhythm and normal heart sounds.  No murmur heard. No BLE edema. Pulmonary/Chest: Effort normal and breath sounds normal. No respiratory distress. Abdominal: Soft. Bowel sounds are normal, no distension. There is no tenderness. no masses MALE GENITALIA: Normal descended testes bilaterally, no masses palpated, no hernias, no lesions, no discharge RECTAL: not done  Musculoskeletal: Normal range of motion, no joint effusions. No gross deformities Neurological: he is alert and oriented to person, place, and time. No cranial nerve deficit. Coordination, balance, strength, speech and gait are normal.  Skin: Skin is warm and dry. No rash noted. No erythema.  Psychiatric: Patient has a normal mood and affect. behavior is normal. Judgment and thought content normal.   Fall Risk:    09/21/2021    1:11 PM 12/01/2020  3:03 PM 02/13/2019    8:48 AM 12/20/2018    2:01 PM 10/10/2017    8:23 AM  Fall Risk   Falls in the past year? 0 0 0 0 No  Number falls in past yr: 0 0 0 0   Injury with Fall? 0 0 0 0   Risk for fall due to : No Fall Risks No Fall Risks     Follow up Falls prevention discussed Falls prevention discussed        Functional Status Survey: Is the patient deaf or have difficulty hearing?: No Does the patient have difficulty seeing, even when wearing glasses/contacts?: No Does the patient have difficulty concentrating, remembering, or making  decisions?: No Does the patient have difficulty walking or climbing stairs?: No Does the patient have difficulty dressing or bathing?: No Does the patient have difficulty doing errands alone such as visiting a doctor's office or shopping?: No    Assessment & Plan  1. Well adult exam  - COMPLETE METABOLIC PANEL WITH GFR - CBC with Differential/Platelet - Hemoglobin A1c - Lipid panel - RPR - HIV Antibody (routine testing w rflx) - GC Probe amplification, urine - Ambulatory referral to Gastroenterology  2. Stage 3a chronic kidney disease (HCC)  - COMPLETE METABOLIC PANEL WITH GFR - CBC with Differential/Platelet  3. Pre-diabetes  - Hemoglobin A1c  4. Lipid screening  - Lipid panel  5. Routine screening for STI (sexually transmitted infection)  - RPR - HIV Antibody (routine testing w rflx) - GC Probe amplification, urine  6. Colon cancer screening  - Ambulatory referral to Gastroenterology     -Prostate cancer screening and PSA options (with potential risks and benefits of testing vs not testing) were discussed along with recent recs/guidelines. -USPSTF grade A and B recommendations reviewed with patient; age-appropriate recommendations, preventive care, screening tests, etc discussed and encouraged; healthy living encouraged; see AVS for patient education given to patient -Discussed importance of 150 minutes of physical activity weekly, eat two servings of fish weekly, eat one serving of tree nuts ( cashews, pistachios, pecans, almonds.Marland Kitchen) every other day, eat 6 servings of fruit/vegetables daily and drink plenty of water and avoid sweet beverages.  -Reviewed Health Maintenance: yes

## 2021-09-21 ENCOUNTER — Ambulatory Visit (INDEPENDENT_AMBULATORY_CARE_PROVIDER_SITE_OTHER): Payer: 59 | Admitting: Family Medicine

## 2021-09-21 ENCOUNTER — Other Ambulatory Visit (HOSPITAL_COMMUNITY)
Admission: RE | Admit: 2021-09-21 | Discharge: 2021-09-21 | Disposition: A | Discharge status: Home / Self Care | Payer: 59 | Source: Ambulatory Visit | Attending: Family Medicine | Admitting: Family Medicine

## 2021-09-21 ENCOUNTER — Encounter: Payer: Self-pay | Admitting: Family Medicine

## 2021-09-21 VITALS — BP 134/76 | HR 67 | Resp 16 | Ht 67.0 in | Wt 187.0 lb

## 2021-09-21 DIAGNOSIS — Z23 Encounter for immunization: Secondary | ICD-10-CM

## 2021-09-21 DIAGNOSIS — Z0000 Encounter for general adult medical examination without abnormal findings: Secondary | ICD-10-CM | POA: Diagnosis not present

## 2021-09-21 DIAGNOSIS — Z113 Encounter for screening for infections with a predominantly sexual mode of transmission: Secondary | ICD-10-CM | POA: Diagnosis present

## 2021-09-21 DIAGNOSIS — R7303 Prediabetes: Secondary | ICD-10-CM

## 2021-09-21 DIAGNOSIS — Z1211 Encounter for screening for malignant neoplasm of colon: Secondary | ICD-10-CM

## 2021-09-21 DIAGNOSIS — Z13220 Encounter for screening for lipoid disorders: Secondary | ICD-10-CM | POA: Diagnosis not present

## 2021-09-21 DIAGNOSIS — N1831 Chronic kidney disease, stage 3a: Secondary | ICD-10-CM

## 2021-09-22 ENCOUNTER — Other Ambulatory Visit: Payer: Self-pay

## 2021-09-22 ENCOUNTER — Telehealth: Payer: Self-pay

## 2021-09-22 DIAGNOSIS — Z1211 Encounter for screening for malignant neoplasm of colon: Secondary | ICD-10-CM

## 2021-09-22 LAB — CBC WITH DIFFERENTIAL/PLATELET
Absolute Monocytes: 525 cells/uL (ref 200–950)
Basophils Absolute: 30 cells/uL (ref 0–200)
Basophils Relative: 0.5 %
Eosinophils Absolute: 112 cells/uL (ref 15–500)
Eosinophils Relative: 1.9 %
HCT: 47.2 % (ref 38.5–50.0)
Hemoglobin: 16.2 g/dL (ref 13.2–17.1)
Lymphs Abs: 3039 cells/uL (ref 850–3900)
MCH: 28 pg (ref 27.0–33.0)
MCHC: 34.3 g/dL (ref 32.0–36.0)
MCV: 81.5 fL (ref 80.0–100.0)
MPV: 9.7 fL (ref 7.5–12.5)
Monocytes Relative: 8.9 %
Neutro Abs: 2195 cells/uL (ref 1500–7800)
Neutrophils Relative %: 37.2 %
Platelets: 271 10*3/uL (ref 140–400)
RBC: 5.79 10*6/uL (ref 4.20–5.80)
RDW: 12.9 % (ref 11.0–15.0)
Total Lymphocyte: 51.5 %
WBC: 5.9 10*3/uL (ref 3.8–10.8)

## 2021-09-22 LAB — COMPLETE METABOLIC PANEL WITH GFR
AG Ratio: 1.7 (calc) (ref 1.0–2.5)
ALT: 39 U/L (ref 9–46)
AST: 25 U/L (ref 10–40)
Albumin: 4.7 g/dL (ref 3.6–5.1)
Alkaline phosphatase (APISO): 42 U/L (ref 36–130)
BUN/Creatinine Ratio: 14 (calc) (ref 6–22)
BUN: 20 mg/dL (ref 7–25)
CO2: 26 mmol/L (ref 20–32)
Calcium: 9.3 mg/dL (ref 8.6–10.3)
Chloride: 102 mmol/L (ref 98–110)
Creat: 1.42 mg/dL — ABNORMAL HIGH (ref 0.60–1.29)
Globulin: 2.8 g/dL (calc) (ref 1.9–3.7)
Glucose, Bld: 72 mg/dL (ref 65–99)
Potassium: 4.3 mmol/L (ref 3.5–5.3)
Sodium: 139 mmol/L (ref 135–146)
Total Bilirubin: 0.9 mg/dL (ref 0.2–1.2)
Total Protein: 7.5 g/dL (ref 6.1–8.1)
eGFR: 62 mL/min/{1.73_m2} (ref >=60)

## 2021-09-22 LAB — LIPID PANEL
Cholesterol: 177 mg/dL (ref <200)
HDL: 56 mg/dL (ref >=40)
LDL Cholesterol (Calc): 102 mg/dL (calc) — ABNORMAL HIGH
Non-HDL Cholesterol (Calc): 121 mg/dL (calc) (ref <130)
Total CHOL/HDL Ratio: 3.2 (calc) (ref <5.0)
Triglycerides: 93 mg/dL (ref <150)

## 2021-09-22 LAB — HEMOGLOBIN A1C
Hgb A1c MFr Bld: 5.9 % of total Hgb — ABNORMAL HIGH (ref <5.7)
Mean Plasma Glucose: 123 mg/dL
eAG (mmol/L): 6.8 mmol/L

## 2021-09-22 LAB — RPR: RPR Ser Ql: NONREACTIVE

## 2021-09-22 LAB — HIV ANTIBODY (ROUTINE TESTING W REFLEX): HIV 1&2 Ab, 4th Generation: NONREACTIVE

## 2021-09-22 MED ORDER — NA SULFATE-K SULFATE-MG SULF 17.5-3.13-1.6 GM/177ML PO SOLN: 1.0000 | Freq: Once | ORAL | 0 refills | Status: AC

## 2021-09-22 NOTE — Telephone Encounter (Signed)
Gastroenterology Pre-Procedure Review  Request Date: 10/25/21 Requesting Physician: Dr. Vicente Males  PATIENT REVIEW QUESTIONS: The patient responded to the following health history questions as indicated:    1. Are you having any GI issues? no 2. Do you have a personal history of Polyps? no 3. Do you have a family history of Colon Cancer or Polyps? no 4. Diabetes Mellitus? no 5. Joint replacements in the past 12 months?no 6. Major health problems in the past 3 months?no 7. Any artificial heart valves, MVP, or defibrillator?no    MEDICATIONS & ALLERGIES:    Patient reports the following regarding taking any anticoagulation/antiplatelet therapy:   Plavix, Coumadin, Eliquis, Xarelto, Lovenox, Pradaxa, Brilinta, or Effient? no Aspirin? no  Patient confirms/reports the following medications:  Current Outpatient Medications  Medication Sig Dispense Refill   diclofenac Sodium (VOLTAREN) 1 % GEL Apply 2 g topically 4 (four) times daily. 100 g 5   Multiple Vitamins-Minerals (CENTRUM MEN PO) Take by mouth.     No current facility-administered medications for this visit.    Patient confirms/reports the following allergies:  No Known Allergies  No orders of the defined types were placed in this encounter.   AUTHORIZATION INFORMATION Primary Insurance: 1D#: Group #:  Secondary Insurance: 1D#: Group #:  SCHEDULE INFORMATION: Date: 10/25/21 Time: Location: [facility]

## 2021-09-23 LAB — URINE CYTOLOGY ANCILLARY ONLY
Chlamydia: NEGATIVE
Comment: NEGATIVE
Comment: NORMAL
Neisseria Gonorrhea: NEGATIVE

## 2021-10-24 ENCOUNTER — Encounter: Payer: Self-pay | Admitting: Gastroenterology

## 2021-10-25 ENCOUNTER — Ambulatory Visit: Payer: 59 | Admitting: Anesthesiology

## 2021-10-25 ENCOUNTER — Encounter
Admission: RE | Disposition: A | Discharge status: Home / Self Care | Payer: Self-pay | Source: Home / Self Care | Attending: Gastroenterology

## 2021-10-25 ENCOUNTER — Encounter: Payer: Self-pay | Admitting: Gastroenterology

## 2021-10-25 ENCOUNTER — Ambulatory Visit
Admission: RE | Admit: 2021-10-25 | Discharge: 2021-10-25 | Disposition: A | Discharge status: Home / Self Care | Payer: 59 | Attending: Gastroenterology | Admitting: Gastroenterology

## 2021-10-25 DIAGNOSIS — N183 Chronic kidney disease, stage 3 unspecified: Secondary | ICD-10-CM | POA: Diagnosis not present

## 2021-10-25 DIAGNOSIS — K635 Polyp of colon: Secondary | ICD-10-CM | POA: Diagnosis not present

## 2021-10-25 DIAGNOSIS — D127 Benign neoplasm of rectosigmoid junction: Secondary | ICD-10-CM | POA: Insufficient documentation

## 2021-10-25 DIAGNOSIS — D120 Benign neoplasm of cecum: Secondary | ICD-10-CM | POA: Diagnosis not present

## 2021-10-25 DIAGNOSIS — Z1211 Encounter for screening for malignant neoplasm of colon: Secondary | ICD-10-CM | POA: Diagnosis not present

## 2021-10-25 DIAGNOSIS — D126 Benign neoplasm of colon, unspecified: Secondary | ICD-10-CM | POA: Diagnosis not present

## 2021-10-25 HISTORY — PX: COLONOSCOPY WITH PROPOFOL: SHX5780

## 2021-10-25 SURGERY — COLONOSCOPY WITH PROPOFOL
Anesthesia: General

## 2021-10-25 MED ORDER — LIDOCAINE HCL (CARDIAC) PF 100 MG/5ML IV SOSY
PREFILLED_SYRINGE | INTRAVENOUS | Status: DC | PRN
  Administered 2021-10-25: 100 mg via INTRAVENOUS

## 2021-10-25 MED ORDER — PROPOFOL 10 MG/ML IV BOLUS
INTRAVENOUS | Status: DC | PRN
  Administered 2021-10-25: 100 mg via INTRAVENOUS
  Administered 2021-10-25: 140 ug/kg/min via INTRAVENOUS

## 2021-10-25 MED ORDER — SODIUM CHLORIDE 0.9 % IV SOLN: INTRAVENOUS | Status: DC

## 2021-10-25 NOTE — Anesthesia Postprocedure Evaluation (Signed)
Anesthesia Post Note  Patient: Ethan Dickerson.  Procedure(s) Performed: COLONOSCOPY WITH PROPOFOL  Patient location during evaluation: PACU Anesthesia Type: General Level of consciousness: awake and alert, oriented and patient cooperative Pain management: pain level controlled Vital Signs Assessment: post-procedure vital signs reviewed and stable Respiratory status: spontaneous breathing, nonlabored ventilation and respiratory function stable Cardiovascular status: blood pressure returned to baseline and stable Postop Assessment: adequate PO intake Anesthetic complications: no   No notable events documented.   Last Vitals:  Vitals:   10/25/21 0943 10/25/21 0953  BP: 102/87   Pulse:    Resp: 17 16  Temp:    SpO2:      Last Pain:  Vitals:   10/25/21 0953  TempSrc:   PainSc: 0-No pain                 Darrin Nipper

## 2021-10-25 NOTE — Op Note (Signed)
[facility] Patient Name: Ethan Dickerson Procedure Date: 10/25/2021 7:45 AM MRN: [id number] Account #: [id number] Date of Birth: 1975-12-22 Admit Type: Outpatient Age: 46 Room: [facility] ENDO ROOM 2 Gender: Male Note Status: Finalized Instrument Name: Jasper Riling [id number] Procedure:             Colonoscopy Indications:           Screening for colorectal malignant neoplasm Providers:             Jonathon Bellows MD, MD Referring MD:          Bethena Roys. Sowles, MD (Referring MD) Medicines:             Monitored Anesthesia Care Complications:         No immediate complications. Procedure:             Pre-Anesthesia Assessment:                        - Prior to the procedure, a History and Physical was                         performed, and patient medications, allergies and                         sensitivities were reviewed. The patient's tolerance                         of previous anesthesia was reviewed.                        - The risks and benefits of the procedure and the                         sedation options and risks were discussed with the                         patient. All questions were answered and informed                         consent was obtained.                        - ASA Grade Assessment: II - A patient with mild                         systemic disease.                        After obtaining informed consent, the colonoscope was                         passed under direct vision. Throughout the procedure,                         the patient's blood pressure, pulse, and oxygen                         saturations were monitored continuously. The                         Colonoscope was  introduced through the anus and                         advanced to the the cecum, identified by the                         appendiceal orifice. The colonoscopy was performed                         with ease. The patient tolerated the procedure well.                          The quality of the bowel preparation was excellent. Findings:      The perianal and digital rectal examinations were normal.      A 5 mm polyp was found in the cecum. The polyp was sessile. The polyp       was removed with a cold snare. Resection and retrieval were complete.      A 7 mm polyp was found in the ascending colon. The polyp was sessile.       The polyp was removed with a cold snare. Resection was complete, but the       polyp tissue was not retrieved.      Three sessile polyps were found in the recto-sigmoid colon. The polyps       were 3 to 4 mm in size. These polyps were removed with a cold biopsy       forceps. Resection and retrieval were complete.      The exam was otherwise without abnormality on direct and retroflexion       views. Impression:            - One 5 mm polyp in the cecum, removed with a cold                         snare. Resected and retrieved.                        - One 7 mm polyp in the ascending colon, removed with                         a cold snare. Complete resection. Polyp tissue not                         retrieved.                        - Three 3 to 4 mm polyps at the recto-sigmoid colon,                         removed with a cold biopsy forceps. Resected and                         retrieved.                        - The examination was otherwise normal on direct and                         retroflexion views. Recommendation:        -  Discharge patient to home (with escort).                        - Resume previous diet.                        - Continue present medications.                        - Await pathology results.                        - Repeat colonoscopy for surveillance based on                         pathology results. Procedure Code(s):     --- Professional ---                        [phone], Colonoscopy, flexible; with removal of                         tumor(s), polyp(s), or other lesion(s) by snare                          technique                        [id number], 42, Colonoscopy, flexible; with biopsy, single                         or multiple Diagnosis Code(s):     --- Professional ---                        K63.5, Polyp of colon                        Z12.11, Encounter for screening for malignant neoplasm                         of colon CPT copyright 2019 American Medical Association. All rights reserved. The codes documented in this report are preliminary and upon coder review may  be revised to meet current compliance requirements. Jonathon Bellows, MD Jonathon Bellows MD, MD 10/25/2021 9:12:02 AM This report has been signed electronically. Number of Addenda: 0 Note Initiated On: 10/25/2021 7:45 AM Scope Withdrawal Time: 0 hours 9 minutes 41 seconds  Total Procedure Duration: 0 hours 17 minutes 49 seconds  Estimated Blood Loss:  Estimated blood loss: none.      [facility]

## 2021-10-25 NOTE — H&P (Signed)
Ethan Bellows, MD [address], [place], [place], [place], [address] [address], [place], [place], [place], [address] Phone: [phone]  Fax: [phone]  Primary Care Physician:  Steele Sizer, MD   Pre-Procedure History & Physical: HPI:  Ethan Mayol. is a 46 y.o. male is here for an colonoscopy.   History reviewed. No pertinent past medical history.  Past Surgical History:  Procedure Laterality Date   CHOLECYSTECTOMY  2004    Prior to Admission medications   Medication Sig Start Date End Date Taking? Authorizing Provider  diclofenac Sodium (VOLTAREN) 1 % GEL Apply 2 g topically 4 (four) times daily. 12/01/20  Yes Sowles, Drue Stager, MD  Multiple Vitamins-Minerals (CENTRUM MEN PO) Take by mouth.   Yes [provider]    Allergies as of 09/22/2021   (No Known Allergies)    Family History  Problem Relation Age of Onset   Diabetes Father    Hypertension Father    Alcohol abuse Father    Alcohol abuse Mother    Cirrhosis Mother    Thyroid disease Sister    Thyroid disease Sister    Breast cancer Paternal Aunt     Social History   Socioeconomic History   Marital status: Single    Spouse name: Not on file   Number of children: 2   Years of education: Not on file   Highest education level: Associate degree: academic program  Occupational History   Occupation: Scientific laboratory technician: [employer]    Comment: [employer]  Tobacco Use   Smoking status: Never   Smokeless tobacco: Never  Vaping Use   Vaping Use: Never used  Substance and Sexual Activity   Alcohol use: No    Alcohol/week: 0.0 standard drinks of alcohol   Drug use: No   Sexual activity: Yes    Partners: Female    Birth control/protection: None  Other Topics Concern   Not on file  Social History Narrative   Works full time , 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    He has two children, older one son and one daughter    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[employer]    Also is a Product manager  for young boys    Social Determinants of Health   Financial Resource Strain: Low Risk  (09/21/2021)   Overall Financial Resource Strain (CARDIA)    Difficulty of Paying Living Expenses: Not hard at all  Food Insecurity: No Food Insecurity (09/21/2021)   Hunger Vital Sign    Worried About Running Out of Food in the Last Year: Never true    [place] in the Last Year: Never true  Transportation Needs: No Transportation Needs (09/21/2021)   PRAPARE - Hydrologist (Medical): No    Lack of Transportation (Non-Medical): No  Physical Activity: Sufficiently Active (09/21/2021)   Exercise Vital Sign    Days of Exercise per Week: 6 days    Minutes of Exercise per Session: 90 min  Stress: No Stress Concern Present (09/21/2021)   [place]    Feeling of Stress : Not at all  Social Connections: Moderately Isolated (09/21/2021)   Social Connection and Isolation Panel [NHANES]    Frequency of Communication with Friends and Family: More than three times a week    Frequency of Social Gatherings with Friends and Family: Once a week    Attends Religious Services: Never  Active Member of Clubs or Organizations: Yes    Attends Archivist Meetings: More than 4 times per year    Marital Status: Never married  Intimate Partner Violence: Not At Risk (09/21/2021)   Humiliation, Afraid, Rape, and Kick questionnaire    Fear of Current or Ex-Partner: No    Emotionally Abused: No    Physically Abused: No    Sexually Abused: No    Review of Systems: See HPI, otherwise negative ROS  Physical Exam: BP 113/83   Pulse 78   Temp (!) 97.1 F (36.2 C) (Temporal)   Resp 18   Ht '5\' 7"'$  (1.702 m)   Wt 83 kg   SpO2 100%   BMI 28.66 kg/m  General:   Alert,  pleasant and cooperative in NAD Head:  Normocephalic and atraumatic. Neck:  Supple; no masses or thyromegaly. Lungs:  Clear throughout to  auscultation, normal respiratory effort.    Heart:  +S1, +S2, Regular rate and rhythm, No edema. Abdomen:  Soft, nontender and nondistended. Normal bowel sounds, without guarding, and without rebound.   Neurologic:  Alert and  oriented x4;  grossly normal neurologically.  Impression/Plan: Ethan Dickerson. is here for an colonoscopy to be performed for Screening colonoscopy average risk   Risks, benefits, limitations, and alternatives regarding  colonoscopy have been reviewed with the patient.  Questions have been answered.  All parties agreeable.   Ethan Bellows, MD  10/25/2021, 8:45 AM \

## 2021-10-25 NOTE — Anesthesia Preprocedure Evaluation (Addendum)
Anesthesia Evaluation  Patient identified by MRN, date of birth, ID band Patient awake    Reviewed: Allergy & Precautions, NPO status , Patient's Chart, lab work & pertinent test results  History of Anesthesia Complications Negative for: history of anesthetic complications  Airway Mallampati: III   Neck ROM: Full    Dental  (+) Implants   Pulmonary neg pulmonary ROS,    Pulmonary exam normal breath sounds clear to auscultation       Cardiovascular Exercise Tolerance: Good negative cardio ROS Normal cardiovascular exam Rhythm:Regular Rate:Normal     Neuro/Psych negative neurological ROS     GI/Hepatic negative GI ROS,   Endo/Other  Prediabetes   Renal/GU Renal disease (stage III CKD)     Musculoskeletal   Abdominal   Peds  Hematology negative hematology ROS (+)   Anesthesia Other Findings   Reproductive/Obstetrics                            Anesthesia Physical Anesthesia Plan  ASA: 2  Anesthesia Plan: General   Post-op Pain Management:    Induction: Intravenous  PONV Risk Score and Plan: 2 and Propofol infusion, TIVA and Treatment may vary due to age or medical condition  Airway Management Planned: Natural Airway  Additional Equipment:   Intra-op Plan:   Post-operative Plan:   Informed Consent: I have reviewed the patients History and Physical, chart, labs and discussed the procedure including the risks, benefits and alternatives for the proposed anesthesia with the patient or authorized representative who has indicated his/her understanding and acceptance.       Plan Discussed with: CRNA  Anesthesia Plan Comments: (LMA/GETA backup discussed.  Patient consented for risks of anesthesia including but not limited to:  - adverse reactions to medications - damage to eyes, teeth, lips or other oral mucosa - nerve damage due to positioning  - sore throat or hoarseness -  damage to heart, brain, nerves, lungs, other parts of body or loss of life  Informed patient about role of CRNA in peri- and intra-operative care.  Patient voiced understanding.)        Anesthesia Quick Evaluation

## 2021-10-25 NOTE — Transfer of Care (Signed)
Immediate Anesthesia Transfer of Care Note  Patient: Ethan Dickerson.  Procedure(s) Performed: COLONOSCOPY WITH PROPOFOL  Patient Location: PACU  Anesthesia Type:General  Level of Consciousness: drowsy  Airway & Oxygen Therapy: Patient Spontanous Breathing  Post-op Assessment: Report given to RN and Post -op Vital signs reviewed and stable  Post vital signs: Reviewed and stable  Last Vitals:  Vitals Value Taken Time  BP 99/68 10/25/21 0913  Temp 35.9 0913  Pulse 80 10/25/21 0913  Resp 18 10/25/21 0913  SpO2 98 % 10/25/21 0913    Last Pain:  Vitals:   10/25/21 0913  TempSrc:   PainSc: 0-No pain         Complications: No notable events documented.

## 2021-10-26 ENCOUNTER — Encounter: Payer: Self-pay | Admitting: Gastroenterology

## 2021-10-26 LAB — SURGICAL PATHOLOGY

## 2021-10-28 ENCOUNTER — Encounter: Payer: Self-pay | Admitting: Gastroenterology

## 2021-11-02 ENCOUNTER — Ambulatory Visit (INDEPENDENT_AMBULATORY_CARE_PROVIDER_SITE_OTHER): Payer: 59 | Admitting: Dermatology

## 2021-11-02 DIAGNOSIS — L503 Dermatographic urticaria: Secondary | ICD-10-CM | POA: Diagnosis not present

## 2021-11-02 DIAGNOSIS — L298 Other pruritus: Secondary | ICD-10-CM

## 2021-11-02 DIAGNOSIS — L853 Xerosis cutis: Secondary | ICD-10-CM

## 2021-11-02 DIAGNOSIS — L299 Pruritus, unspecified: Secondary | ICD-10-CM

## 2021-11-02 NOTE — Patient Instructions (Addendum)
Recommend starting Cerave cream alternating with baby oil after shower Recommend starting Allegra '180mg'$  nightly ~1-2 hours before shower      Due to recent changes in healthcare laws, you may see results of your pathology and/or laboratory studies on MyChart before the doctors have had a chance to review them. We understand that in some cases there may be results that are confusing or concerning to you. Please understand that not all results are received at the same time and often the doctors may need to interpret multiple results in order to provide you with the best plan of care or course of treatment. Therefore, we ask that you please give [place] 2 business days to thoroughly review all your results before contacting the office for clarification. Should we see a critical lab result, you will be contacted sooner.   If You Need Anything After Your Visit  If you have any questions or concerns for your doctor, please call our main line at [phone] and press option 4 to reach your doctor's medical assistant. If no one answers, please leave a voicemail as directed and we will return your call as soon as possible. Messages left after 4 pm will be answered the following business day.   You may also send [place] a message via [place]. We typically respond to MyChart messages within 1-2 business days.  For prescription refills, please ask your pharmacy to contact our office. Our fax number is [phone].  If you have an urgent issue when the clinic is closed that cannot wait until the next business day, you can page your doctor at the number below.    Please note that while we do our best to be available for urgent issues outside of office hours, we are not available 24/7.   If you have an urgent issue and are unable to reach [place], you may choose to seek medical care at your doctor's office, retail clinic, urgent care center, or emergency room.  If you have a medical emergency, please immediately call 911  or go to the emergency department.  Pager Numbers  - Dr. Nehemiah Massed: [phone]  - Dr. Laurence Ferrari: [phone]  - Dr. Nicole Kindred: [phone]  In the event of inclement weather, please call our main line at [phone] for an update on the status of any delays or closures.  Dermatology Medication Tips: Please keep the boxes that topical medications come in in order to help keep track of the instructions about where and how to use these. Pharmacies typically print the medication instructions only on the boxes and not directly on the medication tubes.   If your medication is too expensive, please contact our office at [phone] option 4 or send [place] a message through [place].   We are unable to tell what your co-pay for medications will be in advance as this is different depending on your insurance coverage. However, we may be able to find a substitute medication at lower cost or fill out paperwork to get insurance to cover a needed medication.   If a prior authorization is required to get your medication covered by your insurance company, please allow [place] 1-2 business days to complete this process.  Drug prices often vary depending on where the prescription is filled and some pharmacies may offer cheaper prices.  The website www.goodrx.com contains coupons for medications through different pharmacies. The prices here do not account for what the cost may be with help from insurance (it may be cheaper with your insurance), but the website  can give you the price if you did not use any insurance.  - You can print the associated coupon and take it with your prescription to the pharmacy.  - You may also stop by our office during regular business hours and pick up a GoodRx coupon card.  - If you need your prescription sent electronically to a different pharmacy, notify our office through [facility] or by phone at [phone] option 4.     Si Usted Necesita Algo Despus de Su  Visita  Tambin puede enviarnos un mensaje a travs de Pharmacist, community. Por lo general respondemos a los mensajes de MyChart en el transcurso de 1 a 2 das hbiles.  Para renovar recetas, por favor pida a su farmacia que se ponga en contacto con nuestra oficina. Harland Dingwall de fax es [place] [phone].  Si tiene un asunto urgente cuando la clnica est cerrada y que no puede esperar hasta el siguiente da hbil, puede llamar/localizar a su doctor(a) al nmero que aparece a continuacin.   Por favor, tenga en cuenta que aunque hacemos todo lo posible para estar disponibles para asuntos urgentes fuera del horario de [place], no estamos disponibles las 24 horas del da, los 7 das de la [place].   Si tiene un problema urgente y no puede comunicarse con nosotros, puede optar por buscar atencin mdica  en el consultorio de su doctor(a), en una clnica privada, en un centro de atencin urgente o en una sala de emergencias.  Si tiene Engineering geologist, por favor llame inmediatamente al 911 o vaya a la sala de emergencias.  Nmeros de bper  - Dr. Nehemiah Massed: [phone]  - Dra. Moye: [phone]  - Dra. Nicole Kindred: [phone]  En caso de inclemencias del [place], por favor llame a Johnsie Kindred principal al [phone] para una actualizacin sobre el [place] de cualquier retraso o cierre.  Consejos para la medicacin en dermatologa: Por favor, guarde las cajas en las que vienen los medicamentos de uso tpico para ayudarle a seguir las instrucciones sobre dnde y cmo usarlos. Las farmacias generalmente imprimen las instrucciones del medicamento slo en las cajas y no directamente en los tubos del [place].   Si su medicamento es muy caro, por favor, pngase en contacto con Zigmund Daniel llamando al [phone] y presione la opcin 4 o envenos un mensaje a travs de Pharmacist, community.   No podemos decirle cul ser su copago por los medicamentos por adelantado ya que esto es diferente dependiendo de la  cobertura de su seguro. Sin embargo, es posible que podamos encontrar un medicamento sustituto a Electrical engineer un formulario para que el seguro cubra el medicamento que se considera necesario.   Si se requiere una autorizacin previa para que su compaa de seguros [place] su medicamento, por favor permtanos de 1 a 2 das hbiles para completar este proceso.  Los precios de los medicamentos varan con frecuencia dependiendo del Environmental consultant de dnde se surte la receta y alguna farmacias pueden ofrecer precios ms baratos.  El sitio web www.goodrx.com tiene cupones para medicamentos de Airline pilot. Los precios aqu no tienen en cuenta lo que podra costar con la ayuda del seguro (puede ser ms barato con su seguro), pero el sitio web puede darle el precio si no utiliz Research scientist (physical sciences).  - Puede imprimir el cupn correspondiente y llevarlo con su receta a la farmacia.  - Tambin puede pasar por nuestra oficina durante el horario de atencin regular y Charity fundraiser una tarjeta de cupones de GoodRx.  -  Si necesita que su receta se enve electrnicamente a una farmacia diferente, informe a nuestra oficina a travs de MyChart de [place] o por telfono llamando al [phone] y presione la opcin 4.  

## 2021-11-02 NOTE — Progress Notes (Signed)
   New Patient Visit  Subjective  Ethan Dickerson. is a 46 y.o. male who presents for the following: Itching (Body, ~65yr, only after he showers in the day time and last about 20 minutes, using Dove unscented soap, using baby oil after shower, doesn't see a rash when itching, does not have itching from shower in winter time, has had tattoos for years).  The following portions of the chart were reviewed this encounter and updated as appropriate:   Tobacco  Allergies  Meds  Problems  Med Hx  Surg Hx  Fam Hx     Review of Systems:  No other skin or systemic complaints except as noted in HPI or Assessment and Plan.  Objective  Well appearing patient in no apparent distress; mood and affect are within normal limits.  A focused examination was performed including face, trunk, arms. Relevant physical exam findings are noted in the Assessment and Plan.  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 It has also been associated with myelodysplastic syndrome and hypereosinophilic syndrome. Rarely it has been reported as occurring with essential thrombocythemia and myelofibrosis. Some medications have been reported to induce the condition as well.  Labs from 09/21/21 viewed, pt prediabetic with borderline high HgbA1C. CBC is normal but Hgb and RBC are at high normal range. Eosinophils are in mid to low normal range. Platelets are in mid normal range.  Creatinine is slightly elevated but has been for 4 years. Other kidney tests normal range. Liver tests normal.  HIV and RPR are both negative/normal  Continue Dove unscented soap Recommend starting Cerave cream alternating  with baby oil after shower Recommend starting Allegra '180mg'$  qpm about an hour before showering.  Will check Vitamin B12 levels and Thyroid.  Discussed Dupixent for itch component, but do not recommend at this time.  Related Procedures Vitamin B12 TSH  Consider screening CXR.   Xerosis -Mild dry skin - recommend gentle, hydrating skin care - gentle skin care handout given  Return in about 2 months (around 01/02/2022) for Pruritus f/u.  I, SOthelia Pulling RMA, am acting as scribe for DSarina Ser MD . Documentation: I have reviewed the above documentation for accuracy and completeness, and I agree with the above.  DSarina Ser MD

## 2021-11-03 ENCOUNTER — Telehealth: Payer: Self-pay

## 2021-11-03 LAB — TSH: TSH: 0.997 u[IU]/mL (ref 0.450–4.500)

## 2021-11-03 LAB — VITAMIN B12: Vitamin B-12: 1136 pg/mL (ref 232–1245)

## 2021-11-03 NOTE — Telephone Encounter (Signed)
-----   Message from Ralene Bathe, MD sent at 11/03/2021  1:35 PM EDT ----- B-12 and Thyroid tests from 11/02/2021 were both OK / Normal. These are not cause of itch. Recommend continuing recommended treatments until follow up appt.

## 2021-11-03 NOTE — Telephone Encounter (Signed)
Patient informed of lab results. 

## 2021-11-04 ENCOUNTER — Encounter: Payer: Self-pay | Admitting: Dermatology

## 2021-11-07 ENCOUNTER — Telehealth: Payer: Self-pay

## 2021-11-07 ENCOUNTER — Other Ambulatory Visit: Payer: Self-pay

## 2021-11-07 DIAGNOSIS — L299 Pruritus, unspecified: Secondary | ICD-10-CM

## 2021-11-07 NOTE — Telephone Encounter (Signed)
Advised pt the following about doing the chest x-ray: "Itching can be caused by internal growths (tumors; inflammation etc), so CXR is a screening test.  Most likely will be clear, but worth screening for.  All other screening tests already done."

## 2021-11-07 NOTE — Telephone Encounter (Signed)
-----   Message from Ralene Bathe, MD sent at 11/04/2021  6:57 PM EDT ----- Contact patient and order chest x-ray for screening for " Internal cause of itch" Thanks

## 2021-11-07 NOTE — Telephone Encounter (Signed)
I spoke to pt about the chest x-ray order and he was wondering how a chest x-ray would show any internal cause for itching.  I advised I would forward the question to Dr. Nehemiah Massed and get back with him once I had a response./sh

## 2021-11-07 NOTE — Progress Notes (Signed)
Chest x ray/sh

## 2021-11-08 ENCOUNTER — Ambulatory Visit
Admission: RE | Admit: 2021-11-08 | Discharge: 2021-11-08 | Disposition: A | Discharge status: Home / Self Care | Payer: 59 | Attending: Dermatology | Admitting: Dermatology

## 2021-11-08 ENCOUNTER — Telehealth: Payer: Self-pay

## 2021-11-08 ENCOUNTER — Ambulatory Visit
Admission: RE | Admit: 2021-11-08 | Discharge: 2021-11-08 | Disposition: A | Discharge status: Home / Self Care | Payer: 59 | Source: Ambulatory Visit | Attending: Dermatology | Admitting: Dermatology

## 2021-11-08 DIAGNOSIS — L299 Pruritus, unspecified: Secondary | ICD-10-CM | POA: Diagnosis present

## 2021-11-08 NOTE — Telephone Encounter (Signed)
-----   Message from Ralene Bathe, MD sent at 11/08/2021 12:07 PM EDT ----- Chest Xray from 11/08/2021 was Normal. No evidence of cause for itch. Continue current recommendations and keep follow up appt.

## 2021-11-08 NOTE — Telephone Encounter (Signed)
Discussed chest x ray results with pt

## 2022-01-04 ENCOUNTER — Ambulatory Visit: Payer: 59 | Admitting: Dermatology

## 2022-03-21 NOTE — Progress Notes (Deleted)
Name: Ethan Dickerson.   MRN: [id number]    DOB: January 25, 1976   Date:03/21/2022       Progress Note  Subjective  Chief Complaint  Follow Up  HPI  Elevated Creatinine: he went to have life insurance labs done and his creatinine was high and was given a higher premium. Reviewed old labs with patient GFR for AA was normal, explained that GFR now is the same for everyone , but his Creatinine likely elevated because he is muscular. He has normal urine output. He states he stopped drinking [place], he is not taking protein supplements but has a low carb diet rich in lean meat.   Hyperglycemia/pre diabetes: avoiding a lot of starches, we will recheck A1C Denies polyphagia, polydipsia or polyuria   Chronic low back pain: seeing chiropractor and has been doing well, states no longer having radiculitis   Chronic knee pain: he played multiple sports all his life and has noticed bilateral knee pain, no effusion or redness, pain is daily but intermittent , usually when squatting. Discussed stretching and also try topical medication like biofreeze and voltaren gel   Patient Active Problem List   Diagnosis Date Noted   Encounter for screening colonoscopy    Adenomatous polyp of colon    Chronic kidney disease, stage 3a ([place]) 01/12/2021   Hyperglycemia 04/14/2017   Dyslipidemia 04/14/2017   Chronic low back pain without sciatica 03/09/2015    Past Surgical History:  Procedure Laterality Date   CHOLECYSTECTOMY  2004   COLONOSCOPY WITH PROPOFOL N/A 10/25/2021   Procedure: COLONOSCOPY WITH PROPOFOL;  Surgeon: Jonathon Bellows, MD;  Location: [facility] ENDOSCOPY;  Service: Gastroenterology;  Laterality: N/A;    Family History  Problem Relation Age of Onset   Diabetes Father    Hypertension Father    Alcohol abuse Father    Alcohol abuse Mother    Cirrhosis Mother    Thyroid disease Sister    Thyroid disease Sister    Breast cancer Paternal Aunt     Social History   Tobacco Use   Smoking status: Never    Smokeless tobacco: Never  Substance Use Topics   Alcohol use: No    Alcohol/week: 0.0 standard drinks of alcohol     Current Outpatient Medications:    diclofenac Sodium (VOLTAREN) 1 % GEL, Apply 2 g topically 4 (four) times daily., Disp: 100 g, Rfl: 5   Multiple Vitamins-Minerals (CENTRUM MEN PO), Take by mouth., Disp: , Rfl:   No Known Allergies  I personally reviewed active problem list, medication list, allergies, family history, social history, health maintenance with the patient/caregiver today.   ROS  ***  Objective  There were no vitals filed for this visit.  There is no height or weight on file to calculate BMI.  Physical Exam ***  No results found for this or any previous visit (from the past 2160 hour(s)).   PHQ2/9:    09/21/2021    1:12 PM 12/01/2020    3:04 PM 02/13/2019    8:49 AM 12/20/2018    2:02 PM 10/10/2017    8:24 AM  Depression screen PHQ 2/9  Decreased Interest 0 0 0 0 0  Down, Depressed, Hopeless 0 0 0 0 0  PHQ - 2 Score 0 0 0 0 0  Altered sleeping 0 0 0 0 0  Tired, decreased energy 0 0 0 0 2  Change in appetite 0 0 0 0 0  Feeling bad or failure about yourself  0 0 0  0 0  Trouble concentrating 0 0 0 0 0  Moving slowly or fidgety/restless 0 0 0 0 0  Suicidal thoughts 0 0 0 0 0  PHQ-9 Score 0 0 0 0 2  Difficult doing work/chores    Not difficult at all Not difficult at all    phq 9 is {gen pos [id number]   Fall Risk:    09/21/2021    1:11 PM 12/01/2020    3:03 PM 02/13/2019    8:48 AM 12/20/2018    2:01 PM 10/10/2017    8:23 AM  Fall Risk   Falls in the past year? 0 0 0 0 No  Number falls in past yr: 0 0 0 0   Injury with Fall? 0 0 0 0   Risk for fall due to : No Fall Risks No Fall Risks     Follow up Falls prevention discussed Falls prevention discussed         Functional Status Survey:      Assessment & Plan  *** There are no diagnoses linked to this encounter.

## 2022-03-22 ENCOUNTER — Ambulatory Visit: Payer: 59 | Admitting: Family Medicine

## 2022-09-21 NOTE — Progress Notes (Signed)
Name: Ethan Dickerson.   MRN: [id number]    DOB: [birth year]   Date:09/22/2022       Progress Note  Subjective  Chief Complaint  CPE  HPI  Patient presents for annual CPE.  IPSS Questionnaire (AUA-7): Over the past month.   1)  How often have you had a sensation of not emptying your bladder completely after you finish urinating?  0 - Not at all  2)  How often have you had to urinate again less than two hours after you finished urinating? 0 - Not at all  3)  How often have you found you stopped and started again several times when you urinated?  0 - Not at all  4) How difficult have you found it to postpone urination?  0 - Not at all  5) How often have you had a weak urinary stream?  0 - Not at all  6) How often have you had to push or strain to begin urination?  0 - Not at all  7) How many times did you most typically get up to urinate from the time you went to bed until the time you got up in the morning?  0 - None  Total score:  0-7 mildly symptomatic   8-19 moderately symptomatic   20-35 severely symptomatic     Diet: he is still eating fast food but will start cooking more at home  Exercise: he has been active  Last Dental Exam: up to date  Last Eye Exam: up to date  Depression: phq 9 is negative    09/22/2022    3:02 PM 09/21/2021    1:12 PM 12/01/2020    3:04 PM 02/13/2019    8:49 AM 12/20/2018    2:02 PM  Depression screen PHQ 2/9  Decreased Interest 0 0 0 0 0  Down, Depressed, Hopeless 0 0 0 0 0  PHQ - 2 Score 0 0 0 0 0  Altered sleeping 0 0 0 0 0  Tired, decreased energy 0 0 0 0 0  Change in appetite 0 0 0 0 0  Feeling bad or failure about yourself  0 0 0 0 0  Trouble concentrating 0 0 0 0 0  Moving slowly or fidgety/restless 0 0 0 0 0  Suicidal thoughts 0 0 0 0 0  PHQ-9 Score 0 0 0 0 0  Difficult doing work/chores Not difficult at all    Not difficult at all    Hypertension:  BP Readings from Last 3 Encounters:  09/22/22 108/72  10/25/21 102/87  09/21/21  134/76    Obesity: Wt Readings from Last 3 Encounters:  09/22/22 193 lb 14.4 oz (88 kg)  10/25/21 183 lb (83 kg)  09/21/21 187 lb (84.8 kg)   BMI Readings from Last 3 Encounters:  09/22/22 30.37 kg/m  10/25/21 28.66 kg/m  09/21/21 29.29 kg/m     Lipids:  Lab Results  Component Value Date   CHOL 177 09/21/2021   CHOL 172 02/13/2019   CHOL 186 04/12/2017   Lab Results  Component Value Date   HDL 56 09/21/2021   HDL 48 02/13/2019   HDL 54 04/12/2017   Lab Results  Component Value Date   LDLCALC 102 (H) 09/21/2021   LDLCALC 103 (H) 02/13/2019   LDLCALC 107 (H) 04/12/2017   Lab Results  Component Value Date   TRIG 93 09/21/2021   TRIG 113 02/13/2019   TRIG 132 04/12/2017   Lab Results  Component  Value Date   CHOLHDL 3.2 09/21/2021   CHOLHDL 3.6 02/13/2019   CHOLHDL 3.4 04/12/2017   No results found for: "LDLDIRECT" Glucose:  Glucose, Bld  Date Value Ref Range Status  09/21/2021 72 65 - 99 mg/dL Final    Comment:    .            Fasting reference interval .   12/01/2020 80 65 - 99 mg/dL Final    Comment:    .            Fasting reference interval .   02/13/2019 85 65 - 99 mg/dL Final    Comment:    .            Fasting reference interval .     Flowsheet Row Office Visit from 09/22/2022 in [facility]  AUDIT-C Score 0       Single STD testing and prevention (HIV/chl/gon/syphilis):  today  Sexual history: one partner , no ED  Hep C Screening: 04/12/2017 Skin cancer: Discussed monitoring for atypical lesions Colorectal cancer: 10/25/2021 Prostate cancer:  not applicable  Lung cancer:  Low Dose CT Chest recommended if Age 37-80 years, 30 pack-year currently smoking OR have quit w/in 15years. Patient  not a candidate for screening   AAA: The USPSTF recommends one-time screening with ultrasonography in men ages 89 to 75 years who have ever smoked. Patient not  a candidate for screening  ECG:   03/09/2015  Vaccines:   HPV: Aged Out Tdap: 04/12/2017 Shingrix: N/A Pneumonia: N/A Flu: 09/21/2021, discussed yearly shots  COVID-19:2 doses obtained  Advanced Care Planning: A voluntary discussion about advance care planning including the explanation and discussion of advance directives.  Discussed health care proxy and Living will, and the patient was able to identify a health care proxy as aunt Ethan Dickerson .  Patient does not have a living will and power of attorney of health care   Patient Active Problem List   Diagnosis Date Noted   Encounter for screening colonoscopy    Adenomatous polyp of colon    Chronic kidney disease, stage 3a (HCC) 01/12/2021   Hyperglycemia 04/14/2017   Dyslipidemia 04/14/2017   Chronic low back pain without sciatica 03/09/2015    Past Surgical History:  Procedure Laterality Date   CHOLECYSTECTOMY  2004   COLONOSCOPY WITH PROPOFOL N/A 10/25/2021   Procedure: COLONOSCOPY WITH PROPOFOL;  Surgeon: Wyline Mood, MD;  Location: [facility] ENDOSCOPY;  Service: Gastroenterology;  Laterality: N/A;    Family History  Problem Relation Age of Onset   Diabetes Father    Hypertension Father    Alcohol abuse Father    Alcohol abuse Mother    Cirrhosis Mother    Thyroid disease Sister    Thyroid disease Sister    Breast cancer Paternal Aunt     Social History   Socioeconomic History   Marital status: Single    Spouse name: Not on file   Number of children: 2   Years of education: Not on file   Highest education level: Associate degree: academic program  Occupational History   Occupation: Agricultural engineer: [employer]    Comment: [employer]  Tobacco Use   Smoking status: Never   Smokeless tobacco: Never  Vaping Use   Vaping status: Never Used  Substance and Sexual Activity   Alcohol use: No    Alcohol/week: 0.0 standard drinks of alcohol   Drug use: No   Sexual activity: Yes  Partners: Female    Birth control/protection: None  Other Topics Concern   Not on file   Social History Narrative   Works full time , 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    He has two children, older one son and one daughter    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[employer]    Also is a Dance movement psychotherapist for young boys    Social Determinants of Health   Financial Resource Strain: Low Risk  (09/22/2022)   Overall Financial Resource Strain (CARDIA)    Difficulty of Paying Living Expenses: Not hard at all  Food Insecurity: No Food Insecurity (09/22/2022)   Hunger Vital Sign    Worried About Running Out of Food in the Last Year: Never true    Ran Out of Food in the Last Year: Never true  Transportation Needs: No Transportation Needs (09/22/2022)   PRAPARE - Administrator, Civil Service (Medical): No    Lack of Transportation (Non-Medical): No  Physical Activity: Sufficiently Active (09/22/2022)   Exercise Vital Sign    Days of Exercise per Week: 5 days    Minutes of Exercise per Session: 60 min  Stress: Stress Concern Present (09/22/2022)   [employer] of Occupational Health - Occupational Stress Questionnaire    Feeling of Stress : To some extent  Social Connections: Moderately Isolated (09/22/2022)   Social Connection and Isolation Panel [NHANES]    Frequency of Communication with Friends and Family: Three times a week    Frequency of Social Gatherings with Friends and Family: Three times a week    Attends Religious Services: Never    Active Member of Clubs or Organizations: No    Attends Banker Meetings: More than 4 times per year    Marital Status: Never married  Intimate Partner Violence: Not At Risk (09/22/2022)   Humiliation, Afraid, Rape, and Kick questionnaire    Fear of Current or Ex-Partner: No    Emotionally Abused: No    Physically Abused: No    Sexually Abused: No     Current Outpatient Medications:    diclofenac Sodium (VOLTAREN) 1 % GEL, Apply 2 g topically 4 (four) times daily., Disp: 100 g, Rfl: 5   Multiple  Vitamins-Minerals (CENTRUM MEN PO), Take by mouth., Disp: , Rfl:   No Known Allergies   ROS  Constitutional: Negative for fever , positive for mild weight change.  Respiratory: Negative for cough and shortness of breath.   Cardiovascular: Negative for chest pain or palpitations.  Gastrointestinal: Negative for abdominal pain, no bowel changes.  Musculoskeletal: Negative for gait problem or joint swelling.  Skin: Negative for rash.  Neurological: Negative for dizziness or headache.  No other specific complaints in a complete review of systems (except as listed in HPI above).   Objective  Vitals:   09/22/22 1511  BP: 108/72  Pulse: 71  Resp: 16  Temp: 97.8 F (36.6 C)  TempSrc: Oral  SpO2: 97%  Weight: 193 lb 14.4 oz (88 kg)  Height: 5\' 7"  (1.702 m)    Body mass index is 30.37 kg/m.  Physical Exam  Constitutional: Patient appears well-developed and well-nourished. No distress.  HENT: Head: Normocephalic and atraumatic. Ears: B TMs ok, no erythema or effusion; Nose: Nose normal. Mouth/Throat: Oropharynx is clear and moist. No oropharyngeal exudate.  Eyes: Conjunctivae and EOM are normal. Pupils are equal, round, and reactive to light. No scleral icterus.  Neck: Normal range of motion. Neck supple. No JVD present. No  thyromegaly present.  Cardiovascular: Normal rate, regular rhythm and normal heart sounds.  No murmur heard. No BLE edema. Pulmonary/Chest: Effort normal and breath sounds normal. No respiratory distress. Abdominal: Soft. Bowel sounds are normal, no distension. There is no tenderness. no masses MALE GENITALIA: Normal descended testes bilaterally, no masses palpated, no hernias, no lesions, no discharge RECTAL: not done  Musculoskeletal: Normal range of motion, no joint effusions. No gross deformities Neurological: he is alert and oriented to person, place, and time. No cranial nerve deficit. Coordination, balance, strength, speech and gait are normal.  Skin:  Skin is warm and dry. No rash noted. No erythema.  Psychiatric: Patient has a normal mood and affect. behavior is normal. Judgment and thought content normal.    Fall Risk:    09/22/2022    3:02 PM 09/21/2021    1:11 PM 12/01/2020    3:03 PM 02/13/2019    8:48 AM 12/20/2018    2:01 PM  Fall Risk   Falls in the past year? 0 0 0 0 0  Number falls in past yr: 0 0 0 0 0  Injury with Fall? 0 0 0 0 0  Risk for fall due to : No Fall Risks No Fall Risks No Fall Risks    Follow up Falls prevention discussed;Education provided;Falls evaluation completed Falls prevention discussed Falls prevention discussed       Functional Status Survey: Is the patient deaf or have difficulty hearing?: No Does the patient have difficulty seeing, even when wearing glasses/contacts?: No Does the patient have difficulty concentrating, remembering, or making decisions?: No Does the patient have difficulty walking or climbing stairs?: No Does the patient have difficulty dressing or bathing?: No Does the patient have difficulty doing errands alone such as visiting a doctor's office or shopping?: No    Assessment & Plan  1. Well adult exam  - HgB A1c - Lipid panel - CBC with Differential - COMPLETE METABOLIC PANEL WITH GFR - Urine cytology ancillary only - VITAMIN D 25 Hydroxy (Vit-D Deficiency, Fractures) - RPR  2. Stage 3a chronic kidney disease (HCC)  - VITAMIN D 25 Hydroxy (Vit-D Deficiency, Fractures) Seeing nephrologist yearly, we will recheck labs today , bp is at goal   3. Pre-diabetes  - HgB A1c  4. Lipid screening  - Lipid panel  5. Routine screening for STI (sexually transmitted infection)  - Urine cytology ancillary only - RPR - HIV Antibody (routine testing w rflx)    -Prostate cancer screening and PSA options (with potential risks and benefits of testing vs not testing) were discussed along with recent recs/guidelines. -USPSTF grade A and B recommendations reviewed with  patient; age-appropriate recommendations, preventive care, screening tests, etc discussed and encouraged; healthy living encouraged; see AVS for patient education given to patient -Discussed importance of 150 minutes of physical activity weekly, eat two servings of fish weekly, eat one serving of tree nuts ( cashews, pistachios, pecans, almonds.Marland Kitchen) every other day, eat 6 servings of fruit/vegetables daily and drink plenty of water and avoid sweet beverages.  -Reviewed Health Maintenance: yes

## 2022-09-22 ENCOUNTER — Other Ambulatory Visit (HOSPITAL_COMMUNITY)
Admission: RE | Admit: 2022-09-22 | Discharge: 2022-09-22 | Disposition: A | Discharge status: Home / Self Care | Payer: 59 | Source: Ambulatory Visit | Attending: Family Medicine | Admitting: Family Medicine

## 2022-09-22 ENCOUNTER — Ambulatory Visit (INDEPENDENT_AMBULATORY_CARE_PROVIDER_SITE_OTHER): Payer: 59 | Admitting: Family Medicine

## 2022-09-22 ENCOUNTER — Encounter: Payer: Self-pay | Admitting: Family Medicine

## 2022-09-22 VITALS — BP 108/72 | HR 71 | Temp 97.8°F | Resp 16 | Ht 67.0 in | Wt 193.9 lb

## 2022-09-22 DIAGNOSIS — Z13220 Encounter for screening for lipoid disorders: Secondary | ICD-10-CM

## 2022-09-22 DIAGNOSIS — R7303 Prediabetes: Secondary | ICD-10-CM

## 2022-09-22 DIAGNOSIS — N1831 Chronic kidney disease, stage 3a: Secondary | ICD-10-CM

## 2022-09-22 DIAGNOSIS — Z0000 Encounter for general adult medical examination without abnormal findings: Secondary | ICD-10-CM

## 2022-09-22 DIAGNOSIS — Z113 Encounter for screening for infections with a predominantly sexual mode of transmission: Secondary | ICD-10-CM | POA: Insufficient documentation

## 2022-09-23 LAB — HEMOGLOBIN A1C
Hgb A1c MFr Bld: 6.1 %{Hb} — ABNORMAL HIGH (ref <5.7)
Mean Plasma Glucose: 128 mg/dL
eAG (mmol/L): 7.1 mmol/L

## 2022-09-23 LAB — COMPLETE METABOLIC PANEL WITH GFR
AG Ratio: 1.7 (calc) (ref 1.0–2.5)
ALT: 38 U/L (ref 9–46)
AST: 23 U/L (ref 10–40)
Albumin: 4.3 g/dL (ref 3.6–5.1)
Alkaline phosphatase (APISO): 34 U/L — ABNORMAL LOW (ref 36–130)
BUN/Creatinine Ratio: 14 (calc) (ref 6–22)
BUN: 19 mg/dL (ref 7–25)
CO2: 28 mmol/L (ref 20–32)
Calcium: 9.7 mg/dL (ref 8.6–10.3)
Chloride: 103 mmol/L (ref 98–110)
Creat: 1.38 mg/dL — ABNORMAL HIGH (ref 0.60–1.29)
Globulin: 2.5 g/dL (ref 1.9–3.7)
Glucose, Bld: 90 mg/dL (ref 65–99)
Potassium: 4.3 mmol/L (ref 3.5–5.3)
Sodium: 140 mmol/L (ref 135–146)
Total Bilirubin: 0.6 mg/dL (ref 0.2–1.2)
Total Protein: 6.8 g/dL (ref 6.1–8.1)
eGFR: 64 mL/min/{1.73_m2} (ref >=60)

## 2022-09-23 LAB — CBC WITH DIFFERENTIAL/PLATELET
Absolute Monocytes: 631 {cells}/uL (ref 200–950)
Basophils Absolute: 38 {cells}/uL (ref 0–200)
Basophils Relative: 0.5 %
Eosinophils Absolute: 236 {cells}/uL (ref 15–500)
Eosinophils Relative: 3.1 %
HCT: 44 % (ref 38.5–50.0)
Hemoglobin: 14.6 g/dL (ref 13.2–17.1)
Lymphs Abs: 3420 {cells}/uL (ref 850–3900)
MCH: 27.4 pg (ref 27.0–33.0)
MCHC: 33.2 g/dL (ref 32.0–36.0)
MCV: 82.6 fL (ref 80.0–100.0)
MPV: 10.6 fL (ref 7.5–12.5)
Monocytes Relative: 8.3 %
Neutro Abs: 3276 {cells}/uL (ref 1500–7800)
Neutrophils Relative %: 43.1 %
Platelets: 278 10*3/uL (ref 140–400)
RBC: 5.33 10*6/uL (ref 4.20–5.80)
RDW: 12.7 % (ref 11.0–15.0)
Total Lymphocyte: 45 %
WBC: 7.6 10*3/uL (ref 3.8–10.8)

## 2022-09-23 LAB — LIPID PANEL
Cholesterol: 162 mg/dL (ref <200)
HDL: 51 mg/dL (ref >=40)
LDL Cholesterol (Calc): 76 mg/dL
Non-HDL Cholesterol (Calc): 111 mg/dL (ref <130)
Total CHOL/HDL Ratio: 3.2 (calc) (ref <5.0)
Triglycerides: 269 mg/dL — ABNORMAL HIGH (ref <150)

## 2022-09-23 LAB — HIV ANTIBODY (ROUTINE TESTING W REFLEX): HIV 1&2 Ab, 4th Generation: NONREACTIVE

## 2022-09-23 LAB — VITAMIN D 25 HYDROXY (VIT D DEFICIENCY, FRACTURES): Vit D, 25-Hydroxy: 26 ng/mL — ABNORMAL LOW (ref 30–100)

## 2022-09-23 LAB — RPR: RPR Ser Ql: NONREACTIVE

## 2022-09-29 ENCOUNTER — Encounter: Payer: Self-pay | Admitting: Family Medicine

## 2022-10-05 LAB — URINE CYTOLOGY ANCILLARY ONLY
Chlamydia: NEGATIVE
Comment: NEGATIVE
Comment: NORMAL
Neisseria Gonorrhea: NEGATIVE

## 2023-09-08 IMAGING — US US RENAL
1 series · 14 of 25 positions shown · non-contrast
Comparison: None.

CLINICAL DATA: Chronic kidney disease 3A with abnormal findings in
urine.

EXAM:
RENAL / URINARY TRACT ULTRASOUND COMPLETE

[Series 1: us renal · 14 of 35 slices shown]
[im 1/35]
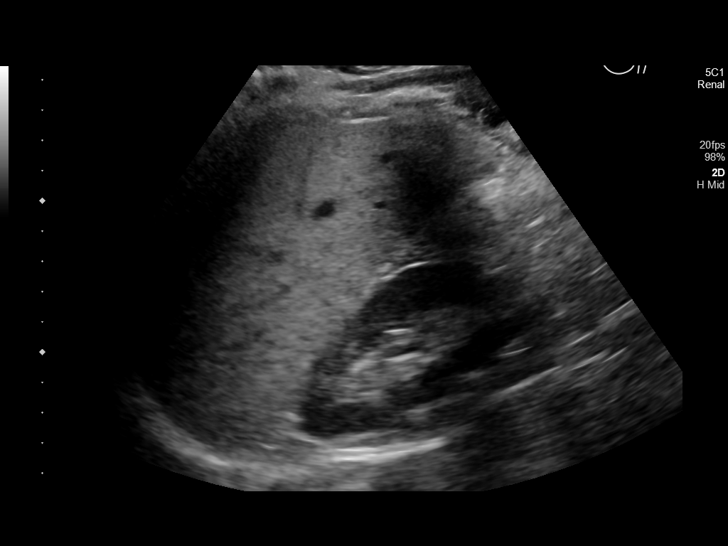
[im 3/35]
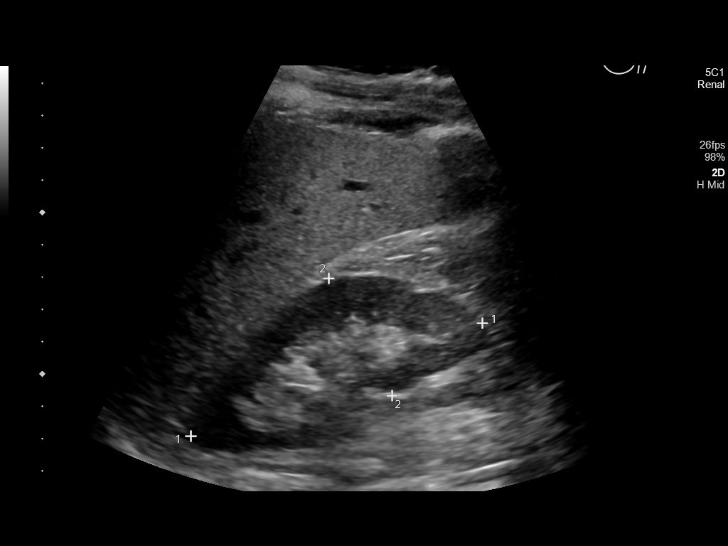
[im 6/35]
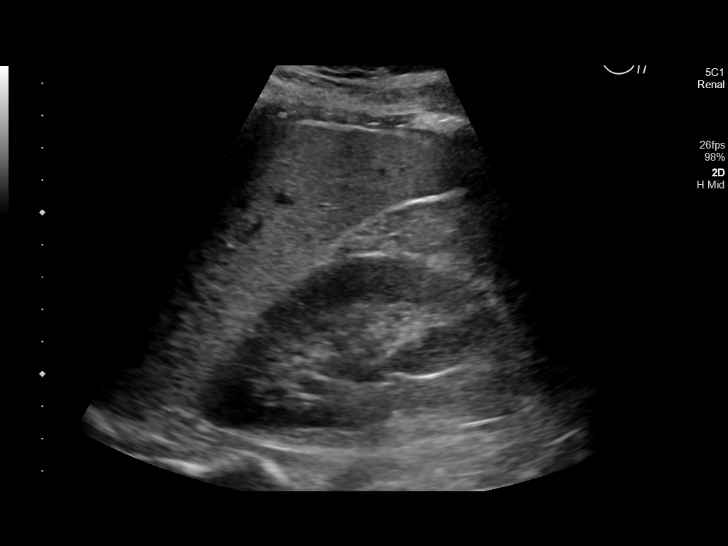
[im 9/35]
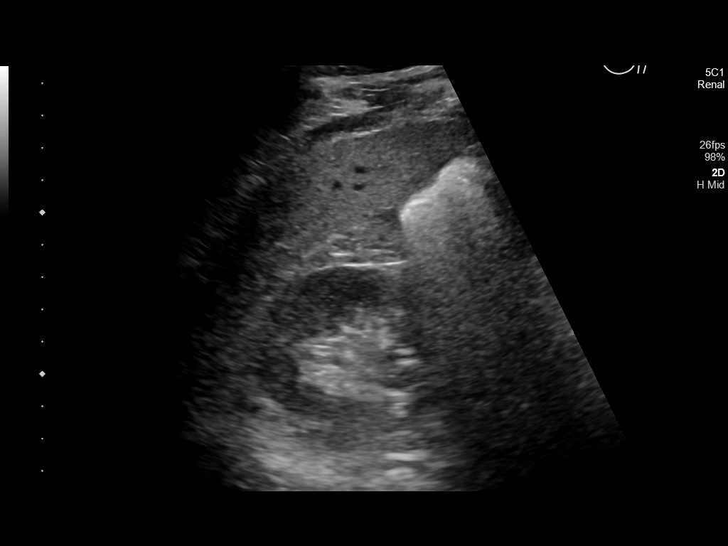
[im 12/35]
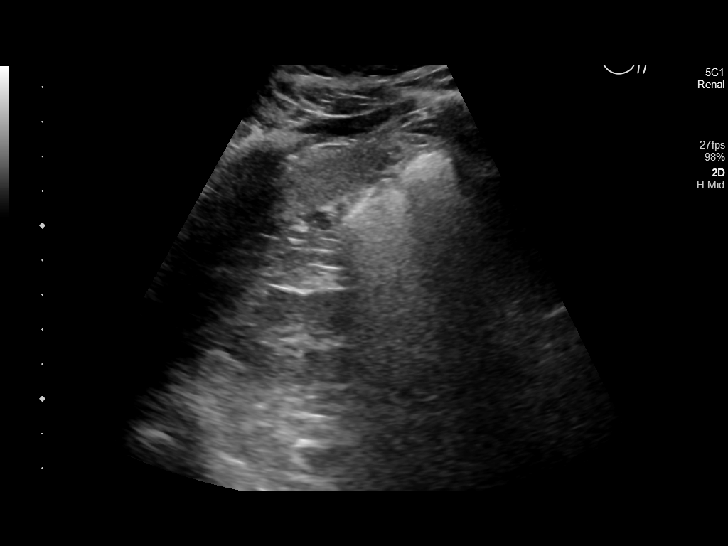
[im 13/35]
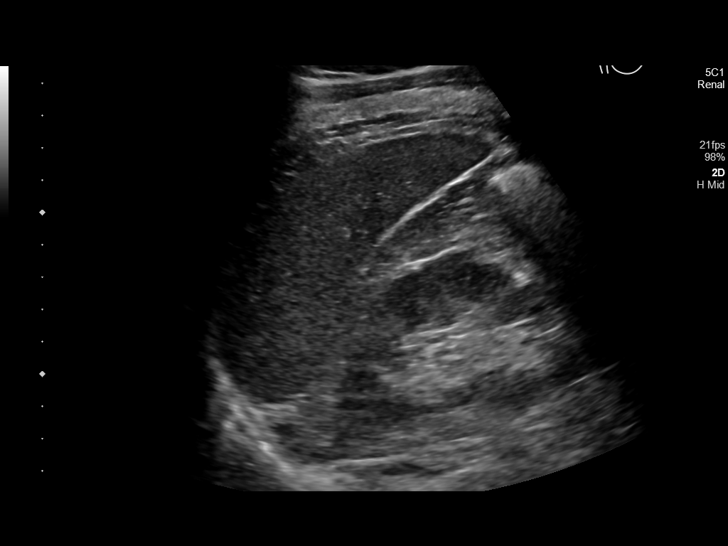
[im 16/35]
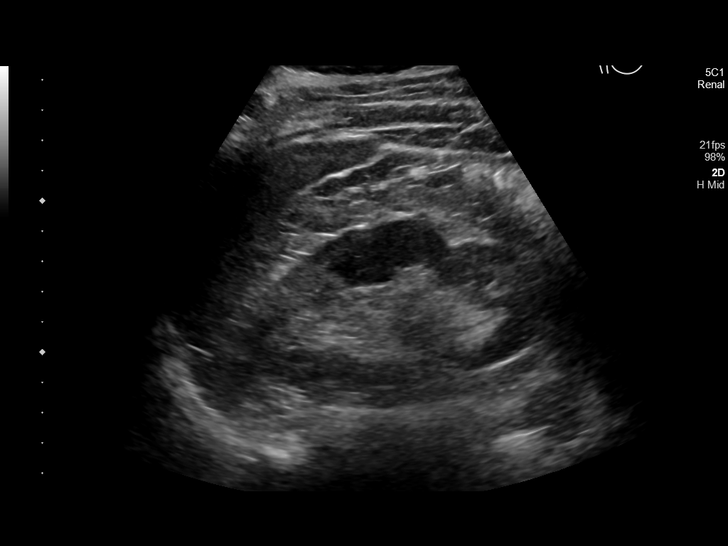
[im 19/35]
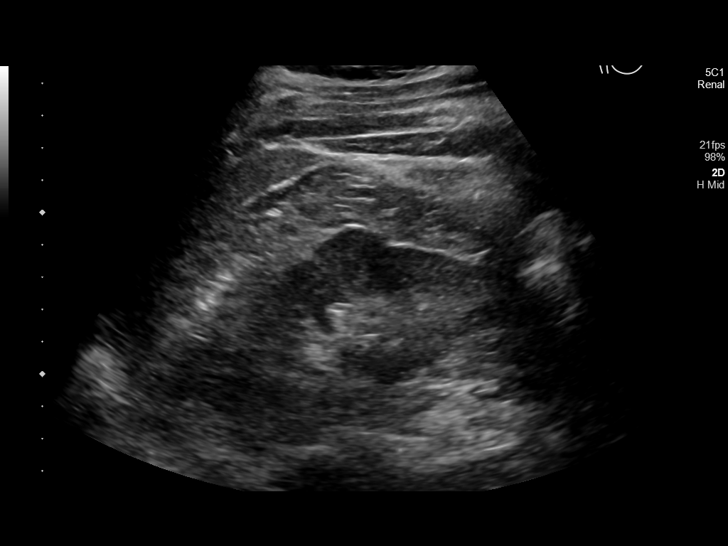
[im 22/35]
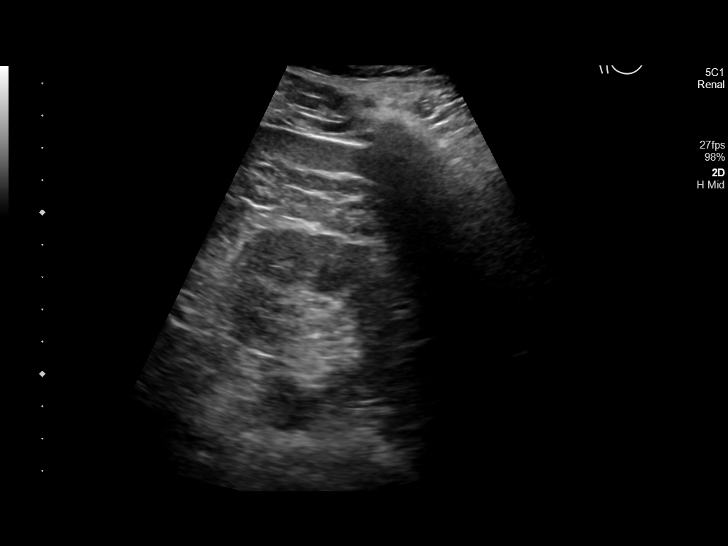
[im 23/35]
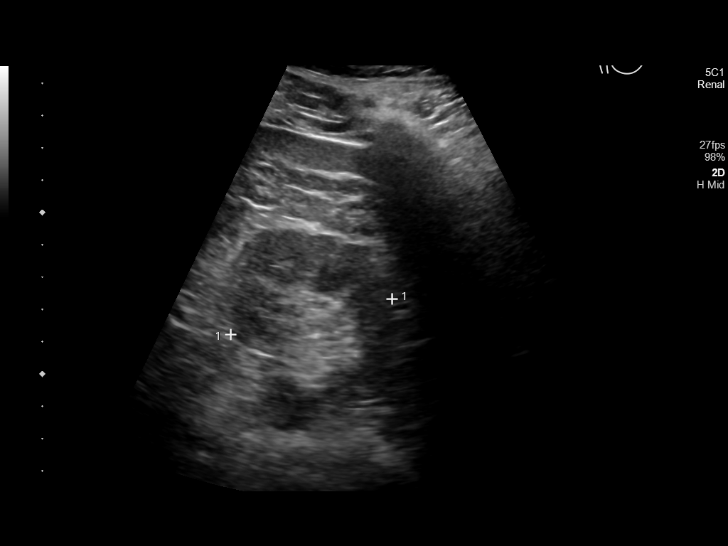
[im 26/35]
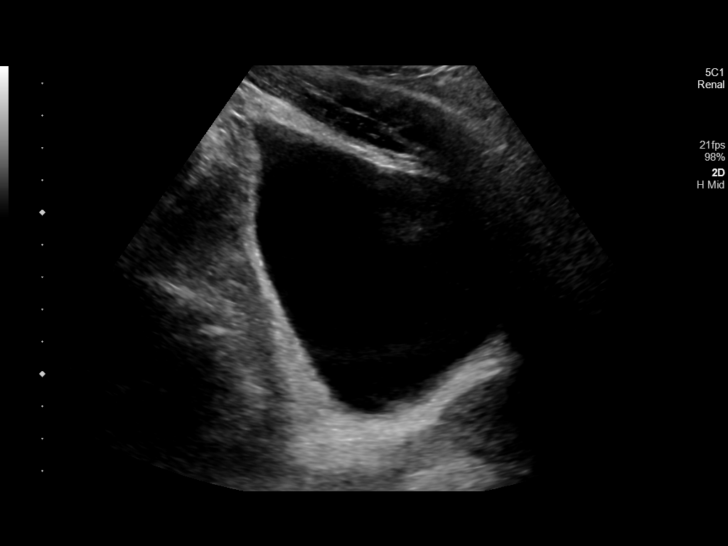
[im 29/35]
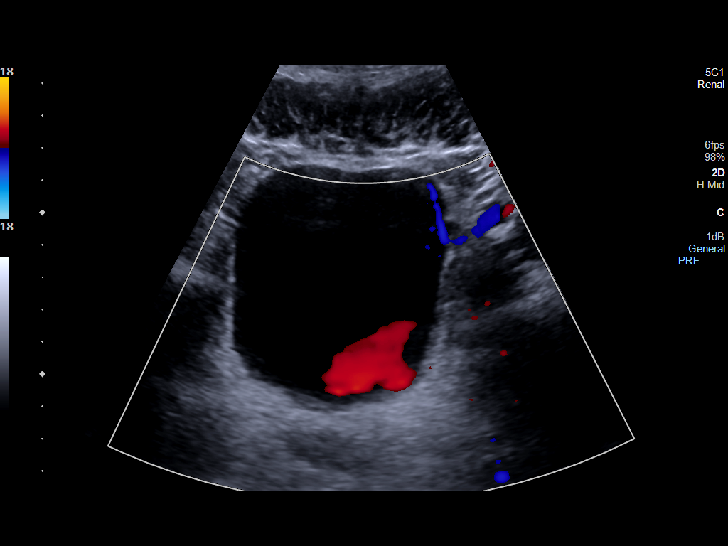
[im 32/35]
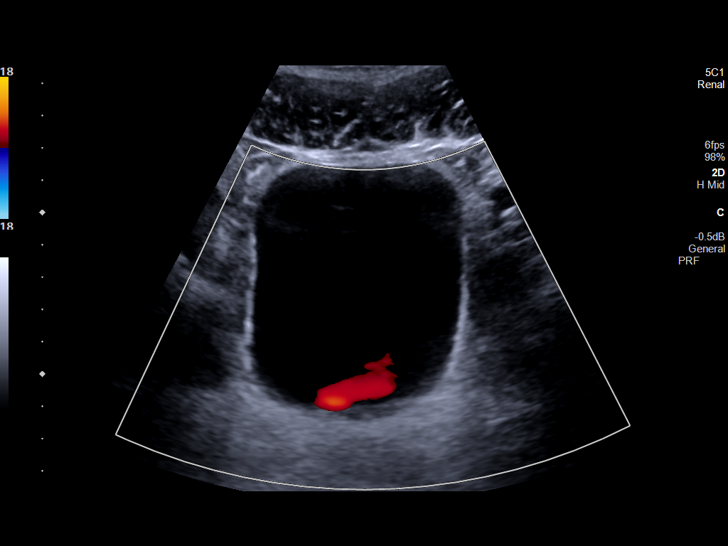
[im 35/35]
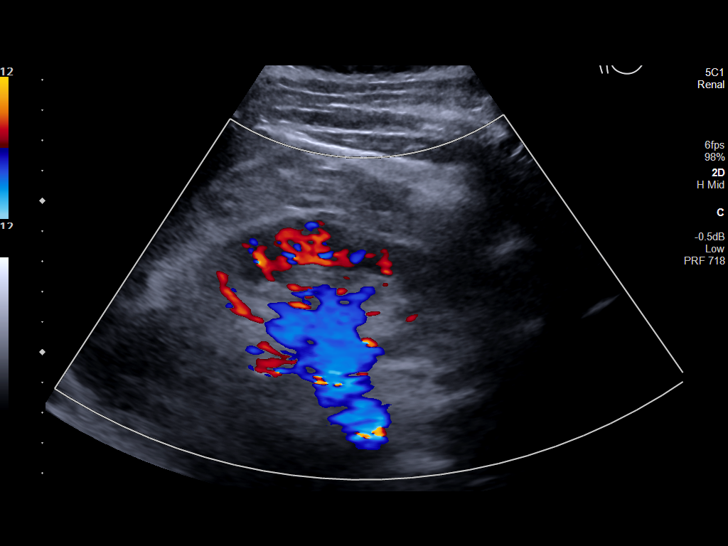

[14 of 25 positions shown; findings below may reference images not displayed]

FINDINGS: Right Kidney:

Renal measurements: 9.7 x 4.1 x 5.2 cm = volume: 108 mL.
Echogenicity within normal limits. No mass or hydronephrosis
visualized.

Left Kidney:

Renal measurements: 10.4 x 5.9 x 5.1 cm = volume: 164 mL.
Echogenicity within normal limits. No mass or hydronephrosis
visualized.

Bladder:

Appears normal for degree of bladder distention. Bilateral ureteral
jets visualized.

Other:

None.
IMPRESSION: Normal size kidneys without hydronephrosis.

## 2023-10-23 NOTE — Patient Instructions (Signed)

## 2023-10-24 ENCOUNTER — Encounter: Payer: Self-pay | Admitting: Family Medicine

## 2023-10-24 ENCOUNTER — Ambulatory Visit: Payer: Self-pay | Admitting: Family Medicine

## 2023-10-24 ENCOUNTER — Other Ambulatory Visit (HOSPITAL_COMMUNITY)
Admission: RE | Admit: 2023-10-24 | Discharge: 2023-10-24 | Disposition: A | Discharge status: Home / Self Care | Source: Ambulatory Visit | Attending: Family Medicine | Admitting: Family Medicine

## 2023-10-24 VITALS — BP 116/68 | HR 67 | Resp 16 | Ht 67.0 in | Wt 190.4 lb

## 2023-10-24 DIAGNOSIS — Z113 Encounter for screening for infections with a predominantly sexual mode of transmission: Secondary | ICD-10-CM | POA: Diagnosis present

## 2023-10-24 DIAGNOSIS — Z1159 Encounter for screening for other viral diseases: Secondary | ICD-10-CM

## 2023-10-24 DIAGNOSIS — Z0000 Encounter for general adult medical examination without abnormal findings: Secondary | ICD-10-CM | POA: Diagnosis present

## 2023-10-24 DIAGNOSIS — R7309 Other abnormal glucose: Secondary | ICD-10-CM

## 2023-10-24 DIAGNOSIS — Z0001 Encounter for general adult medical examination with abnormal findings: Secondary | ICD-10-CM | POA: Diagnosis not present

## 2023-10-24 DIAGNOSIS — Z23 Encounter for immunization: Secondary | ICD-10-CM | POA: Diagnosis not present

## 2023-10-24 DIAGNOSIS — R7303 Prediabetes: Secondary | ICD-10-CM

## 2023-10-24 DIAGNOSIS — E559 Vitamin D deficiency, unspecified: Secondary | ICD-10-CM | POA: Diagnosis not present

## 2023-10-24 DIAGNOSIS — Z13220 Encounter for screening for lipoid disorders: Secondary | ICD-10-CM

## 2023-10-24 MED ORDER — DEXCOM G7 SENSOR MISC
1.0000 | 1 refills | Status: AC
Start: 2023-10-24 — End: ?

## 2023-10-24 NOTE — Progress Notes (Signed)
 Name: Ethan Dickerson.   MRN: [id number]    DOB: 11/28/1975   Date:10/24/2023       Progress Note  Subjective  Chief Complaint  Chief Complaint  Patient presents with   Annual Exam    HPI  Patient presents for annual CPE and follow up  Prediabetes: A1C has been elevated for years. He eats grilled chicken and eggs and stopped eating english muffin from his breakfast. At lunch he has a sub with wheat breat or tacos , for dinner he eats at home. Snacks on popcorn/peanuts. He is avoiding sodas and sweets. Discussed CGM to see what is causing a spike on his sugar . He has been drinking more water therefore voiding more.  Positive family history of diabetes - father had type 2.  Vitamin D  : currently off supplementation, we will recheck level today     IPSS     Row Name 10/24/23 0909         International Prostate Symptom Score   How often have you had the sensation of not emptying your bladder? Not at All     How often have you had to urinate less than every two hours? Not at All     How often have you found you stopped and started again several times when you urinated? Not at All     How often have you found it difficult to postpone urination? Not at All     How often have you had a weak urinary stream? Not at All     How often have you had to strain to start urination? Not at All     How many times did you typically get up at night to urinate? None     Total IPSS Score 0       Quality of Life due to urinary symptoms   If you were to spend the rest of your life with your urinary condition just the way it is now how would you feel about that? Delighted        Diet: balanced Exercise: continue regular physical activity  Last Dental Exam: up to date Last Eye Exam: up to date   Depression: phq 9 is negative    10/24/2023    9:02 AM 09/22/2022    3:02 PM 09/21/2021    1:12 PM 12/01/2020    3:04 PM 02/13/2019    8:49 AM  Depression screen PHQ 2/9  Decreased Interest 0 0 0 0 0   Down, Depressed, Hopeless 0 0 0 0 0  PHQ - 2 Score 0 0 0 0 0  Altered sleeping  0 0 0 0  Tired, decreased energy  0 0 0 0  Change in appetite  0 0 0 0  Feeling bad or failure about yourself   0 0 0 0  Trouble concentrating  0 0 0 0  Moving slowly or fidgety/restless  0 0 0 0  Suicidal thoughts  0 0 0 0  PHQ-9 Score  0 0 0 0  Difficult doing work/chores  Not difficult at all       Hypertension:  BP Readings from Last 3 Encounters:  10/24/23 116/68  09/22/22 108/72  10/25/21 102/87    Obesity: Wt Readings from Last 3 Encounters:  10/24/23 190 lb 6.4 oz (86.4 kg)  09/22/22 193 lb 14.4 oz (88 kg)  10/25/21 183 lb (83 kg)   BMI Readings from Last 3 Encounters:  10/24/23 29.82 kg/m  09/22/22 30.37  kg/m  10/25/21 28.66 kg/m     Flowsheet Row Office Visit from 10/24/2023 in [facility]  AUDIT-C Score 0     Single STD testing and prevention (HIV/chl/gon/syphilis):  yes Sexual history: one partner for years  Hep C Screening: completed Skin cancer: Discussed monitoring for atypical lesions Colorectal cancer: repeat in 2026 Prostate cancer:  not applicable   Lung cancer:  Low Dose CT Chest recommended if Age 70-80 years, 30 pack-year currently smoking OR have quit w/in 15years. Patient  is not a candidate for screening   AAA: The USPSTF recommends one-time screening with ultrasonography in men ages 49 to 75 years who have ever smoked. Patient   is not a candidate for screening  ECG:  2017, discussed coming back in 6 months to have it done  Vaccines: reviewed with the patient. Flu shot today   Advanced Care Planning: A voluntary discussion about advance care planning including the explanation and discussion of advance directives.  Discussed health care proxy and Living will, and the patient was able to identify a health care proxy as aunt - Vivian.  Patient does not have a living will and power of attorney of health care   Patient Active Problem  List   Diagnosis Date Noted   Adenomatous polyp of colon    Hyperglycemia 04/14/2017   Dyslipidemia 04/14/2017   Chronic low back pain without sciatica 03/09/2015    Past Surgical History:  Procedure Laterality Date   CHOLECYSTECTOMY  2004   COLONOSCOPY WITH PROPOFOL  N/A 10/25/2021   Procedure: COLONOSCOPY WITH PROPOFOL ;  Surgeon: Therisa Bi, MD;  Location: [facility] ENDOSCOPY;  Service: Gastroenterology;  Laterality: N/A;    Family History  Problem Relation Age of Onset   Diabetes Father    Hypertension Father    Alcohol abuse Father    Alcohol abuse Mother    Cirrhosis Mother    Thyroid disease Sister    Thyroid disease Sister    Breast cancer Paternal Aunt     Social History   Socioeconomic History   Marital status: Single    Spouse name: Not on file   Number of children: 2   Years of education: Not on file   Highest education level: Associate degree: academic program  Occupational History   Occupation: Agricultural engineer: [employer]    Comment: [employer]  Tobacco Use   Smoking status: Never   Smokeless tobacco: Never  Vaping Use   Vaping status: Never Used  Substance and Sexual Activity   Alcohol use: No    Alcohol/week: 0.0 standard drinks of alcohol   Drug use: No   Sexual activity: Yes    Partners: Female    Birth control/protection: None  Other Topics Concern   Not on file  Social History Narrative   Works full time , 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    He has two children, older one son and one daughter    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[employer]    Also is a Dance movement psychotherapist for young boys    Social Drivers of Corporate investment banker Strain: Low Risk  (10/24/2023)   Overall Financial Resource Strain (CARDIA)    Difficulty of Paying Living Expenses: Not hard at all  Food Insecurity: No Food Insecurity (10/24/2023)   Hunger Vital Sign    Worried About Running Out of Food in the Last Year: Never true    Ran Out of Food in the Last Year: Never  true   Transportation Needs: No Transportation Needs (10/24/2023)   PRAPARE - Administrator, Civil Service (Medical): No    Lack of Transportation (Non-Medical): No  Physical Activity: Sufficiently Active (10/24/2023)   Exercise Vital Sign    Days of Exercise per Week: 4 days    Minutes of Exercise per Session: 120 min  Stress: Stress Concern Present (10/24/2023)   [employer] of Occupational Health - Occupational Stress Questionnaire    Feeling of Stress: To some extent  Social Connections: Moderately Isolated (10/24/2023)   Social Connection and Isolation Panel    Frequency of Communication with Friends and Family: Three times a week    Frequency of Social Gatherings with Friends and Family: Three times a week    Attends Religious Services: Never    Active Member of Clubs or Organizations: No    Attends Engineer, structural: More than 4 times per year    Marital Status: Never married  Intimate Partner Violence: Not At Risk (10/24/2023)   Humiliation, Afraid, Rape, and Kick questionnaire    Fear of Current or Ex-Partner: No    Emotionally Abused: No    Physically Abused: No    Sexually Abused: No     Current Outpatient Medications:    Multiple Vitamins-Minerals (CENTRUM MEN PO), Take by mouth., Disp: , Rfl:    diclofenac  Sodium (VOLTAREN ) 1 % GEL, Apply 2 g topically 4 (four) times daily. (Patient not taking: Reported on 10/24/2023), Disp: 100 g, Rfl: 5  No Known Allergies   ROS  Constitutional: Negative for fever or weight change.  Respiratory: Negative for cough and shortness of breath.   Cardiovascular: Negative for chest pain or palpitations.  Gastrointestinal: Negative for abdominal pain, no bowel changes.  Musculoskeletal: Negative for gait problem or joint swelling.  Skin: Negative for rash.  Neurological: Negative for dizziness or headache.  No other specific complaints in a complete review of systems (except as listed in HPI above).     Objective  Vitals:   10/24/23 0910  BP: 116/68  Pulse: 67  Resp: 16  SpO2: 97%  Weight: 190 lb 6.4 oz (86.4 kg)  Height: 5' 7 (1.702 m)    Body mass index is 29.82 kg/m.  Physical Exam  Constitutional: Patient appears well-developed and well-nourished. No distress.  HENT: Head: Normocephalic and atraumatic. Ears: B TMs ok, no erythema or effusion; Nose: Nose normal. Mouth/Throat: Oropharynx is clear and moist. No oropharyngeal exudate.  Eyes: Conjunctivae and EOM are normal. Pupils are equal, round, and reactive to light. No scleral icterus.  Neck: Normal range of motion. Neck supple. No JVD present. No thyromegaly present.  Cardiovascular: Normal rate, regular rhythm and normal heart sounds.  No murmur heard. No BLE edema. Pulmonary/Chest: Effort normal and breath sounds normal. No respiratory distress. Abdominal: Soft. Bowel sounds are normal, no distension. There is no tenderness. no masses MALE GENITALIA: Normal descended testes bilaterally, no masses palpated, no hernias, no lesions, no discharge RECTAL: not done  Musculoskeletal: Normal range of motion, no joint effusions. No gross deformities Neurological: he is alert and oriented to person, place, and time. No cranial nerve deficit. Coordination, balance, strength, speech and gait are normal.  Skin: Skin is warm and dry. No rash noted. No erythema.  Psychiatric: Patient has a normal mood and affect. behavior is normal. Judgment and thought content normal.     Assessment & Plan  1. Well adult exam (Primary)  - Comprehensive metabolic panel with GFR - CBC  with Differential/Platelet - Lipid panel - VITAMIN D  25 Hydroxy (Vit-D Deficiency, Fractures) - Hemoglobin A1c - HIV Antibody (routine testing w rflx) - RPR - Urine cytology ancillary only - Anti-islet cell antibody - Glutamic acid decarboxylase auto abs - Insulin, Free (Bioactive) - C-peptide  2. Pre-diabetes  - Hemoglobin A1c - Anti-islet cell  antibody - Glutamic acid decarboxylase auto abs - Insulin, Free (Bioactive) - C-peptide  3. Immunization due  - Flu vaccine trivalent PF, 6mos and older(Flulaval,Afluria,Fluarix,Fluzone)  4. Need for hepatitis B screening test  - Hepatitis B Surface AntiBODY  5. Lipid screening  - Lipid panel  6. Routine screening for STI (sexually transmitted infection)  - HIV Antibody (routine testing w rflx) - RPR - Urine cytology ancillary only  7. Vitamin D  deficiency  - VITAMIN D  25 Hydroxy (Vit-D Deficiency, Fractures)  8. Elevated glucose level  - Anti-islet cell antibody - Glutamic acid decarboxylase auto abs - Insulin, Free (Bioactive) - C-peptide    -Prostate cancer screening and PSA options (with potential risks and benefits of testing vs not testing) were discussed along with recent recs/guidelines. -USPSTF grade A and B recommendations reviewed with patient; age-appropriate recommendations, preventive care, screening tests, etc discussed and encouraged; healthy living encouraged; see AVS for patient education given to patient -Discussed importance of 150 minutes of physical activity weekly, eat two servings of fish weekly, eat one serving of tree nuts ( cashews, pistachios, pecans, almonds.SABRA) every other day, eat 6 servings of fruit/vegetables daily and drink plenty of water and avoid sweet beverages.  -Reviewed Health Maintenance: yes

## 2023-10-25 LAB — URINE CYTOLOGY ANCILLARY ONLY
Chlamydia: NEGATIVE
Comment: NEGATIVE
Comment: NEGATIVE
Comment: NORMAL
Neisseria Gonorrhea: NEGATIVE
Trichomonas: NEGATIVE

## 2023-10-26 ENCOUNTER — Ambulatory Visit: Payer: Self-pay | Admitting: Family Medicine

## 2023-10-30 ENCOUNTER — Ambulatory Visit

## 2023-10-30 DIAGNOSIS — Z23 Encounter for immunization: Secondary | ICD-10-CM | POA: Diagnosis not present

## 2023-10-30 LAB — CBC WITH DIFFERENTIAL/PLATELET
Absolute Lymphocytes: 2697 {cells}/uL (ref 850–3900)
Absolute Monocytes: 415 {cells}/uL (ref 200–950)
Basophils Absolute: 31 {cells}/uL (ref 0–200)
Basophils Relative: 0.5 %
Eosinophils Absolute: 161 {cells}/uL (ref 15–500)
Eosinophils Relative: 2.6 %
HCT: 49.2 % (ref 38.5–50.0)
Hemoglobin: 16.1 g/dL (ref 13.2–17.1)
MCH: 27.5 pg (ref 27.0–33.0)
MCHC: 32.7 g/dL (ref 32.0–36.0)
MCV: 84.1 fL (ref 80.0–100.0)
MPV: 10.4 fL (ref 7.5–12.5)
Monocytes Relative: 6.7 %
Neutro Abs: 2895 {cells}/uL (ref 1500–7800)
Neutrophils Relative %: 46.7 %
Platelets: 293 Thousand/uL (ref 140–400)
RBC: 5.85 Million/uL — ABNORMAL HIGH (ref 4.20–5.80)
RDW: 13 % (ref 11.0–15.0)
Total Lymphocyte: 43.5 %
WBC: 6.2 Thousand/uL (ref 3.8–10.8)

## 2023-10-30 LAB — COMPREHENSIVE METABOLIC PANEL WITH GFR
AG Ratio: 1.6 (calc) (ref 1.0–2.5)
ALT: 43 U/L (ref 9–46)
AST: 24 U/L (ref 10–40)
Albumin: 4.8 g/dL (ref 3.6–5.1)
Alkaline phosphatase (APISO): 37 U/L (ref 36–130)
BUN/Creatinine Ratio: 15 (calc) (ref 6–22)
BUN: 20 mg/dL (ref 7–25)
CO2: 29 mmol/L (ref 20–32)
Calcium: 10 mg/dL (ref 8.6–10.3)
Chloride: 102 mmol/L (ref 98–110)
Creat: 1.34 mg/dL — ABNORMAL HIGH (ref 0.60–1.29)
Globulin: 3 g/dL (ref 1.9–3.7)
Glucose, Bld: 77 mg/dL (ref 65–99)
Potassium: 4.8 mmol/L (ref 3.5–5.3)
Sodium: 139 mmol/L (ref 135–146)
Total Bilirubin: 0.9 mg/dL (ref 0.2–1.2)
Total Protein: 7.8 g/dL (ref 6.1–8.1)
eGFR: 66 mL/min/1.73m2 (ref >=60)

## 2023-10-30 LAB — LIPID PANEL
Cholesterol: 163 mg/dL (ref <200)
HDL: 55 mg/dL (ref >=40)
LDL Cholesterol (Calc): 90 mg/dL
Non-HDL Cholesterol (Calc): 108 mg/dL (ref <130)
Total CHOL/HDL Ratio: 3 (calc) (ref <5.0)
Triglycerides: 87 mg/dL (ref <150)

## 2023-10-30 LAB — HIV ANTIBODY (ROUTINE TESTING W REFLEX)
HIV 1&2 Ab, 4th Generation: NONREACTIVE
HIV FINAL INTERPRETATION: NEGATIVE

## 2023-10-30 LAB — HEPATITIS B SURFACE ANTIBODY,QUALITATIVE: Hep B S Ab: NONREACTIVE

## 2023-10-30 LAB — INSULIN, FREE (BIOACTIVE): Insulin, Free: 6.9 u[IU]/mL (ref 1.5–14.9)

## 2023-10-30 LAB — GLUTAMIC ACID DECARBOXYLASE AUTO ABS: Glutamic Acid Decarb Ab: <5 [IU]/mL (ref <5)

## 2023-10-30 LAB — HEMOGLOBIN A1C
Hgb A1c MFr Bld: 6.1 % — ABNORMAL HIGH (ref <5.7)
Mean Plasma Glucose: 128 mg/dL
eAG (mmol/L): 7.1 mmol/L

## 2023-10-30 LAB — RPR: RPR Ser Ql: NONREACTIVE

## 2023-10-30 LAB — VITAMIN D 25 HYDROXY (VIT D DEFICIENCY, FRACTURES): Vit D, 25-Hydroxy: 24 ng/mL — ABNORMAL LOW (ref 30–100)

## 2023-10-30 LAB — C-PEPTIDE: C-Peptide: 1.4 ng/mL (ref 0.80–3.85)

## 2023-11-21 ENCOUNTER — Encounter: Payer: Self-pay | Admitting: Nurse Practitioner

## 2023-11-21 ENCOUNTER — Ambulatory Visit: Admitting: Nurse Practitioner

## 2023-11-21 VITALS — BP 112/86 | HR 61 | Temp 98.1°F | Ht 67.0 in | Wt 191.0 lb

## 2023-11-21 DIAGNOSIS — M5442 Lumbago with sciatica, left side: Secondary | ICD-10-CM | POA: Diagnosis not present

## 2023-11-21 DIAGNOSIS — G8929 Other chronic pain: Secondary | ICD-10-CM

## 2023-11-21 MED ORDER — PREDNISONE 10 MG (21) PO TBPK: ORAL_TABLET | ORAL | 0 refills | Status: AC

## 2023-11-21 MED ORDER — NAPROXEN 500 MG PO TABS: 500.0000 mg | ORAL_TABLET | Freq: Two times a day (BID) | ORAL | 0 refills | Status: AC

## 2023-11-21 MED ORDER — TIZANIDINE HCL 4 MG PO TABS: 4.0000 mg | ORAL_TABLET | Freq: Four times a day (QID) | ORAL | 0 refills | Status: AC | PRN

## 2023-11-21 NOTE — Assessment & Plan Note (Addendum)
 Referral sent to orthopedics. Begin taking Naproxen 500 mg twice a day with food for 10 days. Also begin taking prednisone  taper pack. Can take Tizanidine 4 mg every 6 hours as needed for muscle spasms. Advised to continue with heat and stretching
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
## 2024-10-27 ENCOUNTER — Encounter: Admitting: Family Medicine
